# Patient Record
Sex: Female | Born: 1995 | Race: White | Hispanic: No | Marital: Single | Smoking: Current every day smoker
Health system: Southern US, Community
[De-identification: ages and names within clinical notes are randomized; demographics above are authoritative.]

## PROBLEM LIST (undated history)

## (undated) DIAGNOSIS — J45909 Unspecified asthma, uncomplicated: Secondary | ICD-10-CM

## (undated) HISTORY — PX: INCISE AND DRAIN ABCESS: PRO64

## (undated) HISTORY — DX: Unspecified asthma, uncomplicated: J45.909

---
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
## 2017-07-02 ENCOUNTER — Other Ambulatory Visit: Payer: Self-pay

## 2017-07-02 ENCOUNTER — Encounter: Payer: Self-pay | Admitting: Emergency Medicine

## 2017-07-02 ENCOUNTER — Emergency Department
Admission: EM | Admit: 2017-07-02 | Discharge: 2017-07-02 | Disposition: A | Discharge status: Home / Self Care | Payer: Medicaid Other | Attending: Emergency Medicine | Admitting: Emergency Medicine

## 2017-07-02 DIAGNOSIS — F17210 Nicotine dependence, cigarettes, uncomplicated: Secondary | ICD-10-CM | POA: Diagnosis not present

## 2017-07-02 DIAGNOSIS — N390 Urinary tract infection, site not specified: Secondary | ICD-10-CM

## 2017-07-02 DIAGNOSIS — R300 Dysuria: Secondary | ICD-10-CM

## 2017-07-02 DIAGNOSIS — J45909 Unspecified asthma, uncomplicated: Secondary | ICD-10-CM | POA: Diagnosis not present

## 2017-07-02 DIAGNOSIS — Z79899 Other long term (current) drug therapy: Secondary | ICD-10-CM | POA: Insufficient documentation

## 2017-07-02 LAB — COMPREHENSIVE METABOLIC PANEL
ALBUMIN: 4.4 g/dL (ref 3.5–5.0)
ALT: 11 U/L — AB (ref 14–54)
AST: 23 U/L (ref 15–41)
Alkaline Phosphatase: 66 U/L (ref 38–126)
Anion gap: 7 (ref 5–15)
BUN: 13 mg/dL (ref 6–20)
CHLORIDE: 109 mmol/L (ref 101–111)
CO2: 23 mmol/L (ref 22–32)
CREATININE: 0.8 mg/dL (ref 0.44–1.00)
Calcium: 8.9 mg/dL (ref 8.9–10.3)
GFR calc Af Amer: >60 mL/min (ref >60)
GLUCOSE: 106 mg/dL — AB (ref 65–99)
Potassium: 4 mmol/L (ref 3.5–5.1)
Sodium: 139 mmol/L (ref 135–145)
Total Bilirubin: 0.4 mg/dL (ref 0.3–1.2)
Total Protein: 7.6 g/dL (ref 6.5–8.1)

## 2017-07-02 LAB — URINALYSIS, COMPLETE (UACMP) WITH MICROSCOPIC
Bilirubin Urine: NEGATIVE
Glucose, UA: NEGATIVE mg/dL
Ketones, ur: 5 mg/dL — AB
Nitrite: NEGATIVE
PH: 5 (ref 5.0–8.0)
Protein, ur: 100 mg/dL — AB
RBC / HPF: >50 RBC/hpf — ABNORMAL HIGH (ref 0–5)
SPECIFIC GRAVITY, URINE: 1.04 — AB (ref 1.005–1.030)
WBC, UA: >50 WBC/hpf — ABNORMAL HIGH (ref 0–5)

## 2017-07-02 LAB — CBC
HEMATOCRIT: 39.3 % (ref 35.0–47.0)
Hemoglobin: 13.4 g/dL (ref 12.0–16.0)
MCH: 29.2 pg (ref 26.0–34.0)
MCHC: 34.2 g/dL (ref 32.0–36.0)
MCV: 85.5 fL (ref 80.0–100.0)
PLATELETS: 241 10*3/uL (ref 150–440)
RBC: 4.6 MIL/uL (ref 3.80–5.20)
RDW: 13.9 % (ref 11.5–14.5)
WBC: 10.6 10*3/uL (ref 3.6–11.0)

## 2017-07-02 LAB — POCT PREGNANCY, URINE: PREG TEST UR: NEGATIVE

## 2017-07-02 MED ORDER — PHENAZOPYRIDINE HCL 200 MG PO TABS
200.0000 mg | ORAL_TABLET | Freq: Once | ORAL | Status: AC
  Administered 2017-07-02: 200 mg via ORAL
  Filled 2017-07-02 (×2): qty 1

## 2017-07-02 MED ORDER — PHENAZOPYRIDINE HCL 200 MG PO TABS: 200.0000 mg | ORAL_TABLET | Freq: Three times a day (TID) | ORAL | 0 refills | Status: AC | PRN

## 2017-07-02 MED ORDER — NITROFURANTOIN MONOHYD MACRO 100 MG PO CAPS
100.0000 mg | ORAL_CAPSULE | Freq: Once | ORAL | Status: AC
  Administered 2017-07-02: 100 mg via ORAL
  Filled 2017-07-02 (×2): qty 1

## 2017-07-02 MED ORDER — NITROFURANTOIN MONOHYD MACRO 100 MG PO CAPS: 100.0000 mg | ORAL_CAPSULE | Freq: Two times a day (BID) | ORAL | 0 refills | Status: AC

## 2017-07-02 NOTE — ED Provider Notes (Signed)
[facility] Emergency Department Provider Note   ____________________________________________   I have reviewed the triage vital signs and the nursing notes.   HISTORY  Chief Complaint Abdominal Pain   History limited by: Not Limited   HPI Paige Mitchell is a 22 y.o. female who presents to the emergency department today because of concerns for abdominal pain and dysuria. The patient states the symptoms have been present for the past few days. She describes the pain as burning. She has also had increased frequency and sense of urgency. She has had some pain in her lower back. Denies any fevers. She is not currently menstrauting.   Per medical record review patient has a history of asthma.  Past Medical History:  Diagnosis Date  . Asthma     Patient Active Problem List   Diagnosis Date Noted  . Pilonidal cyst with abscess     Past Surgical History:  Procedure Laterality Date  . COLONOSCOPY WITH ESOPHAGOGASTRODUODENOSCOPY (EGD)  2014   [facility]  . INCISE AND DRAIN ABCESS     Right Groin- Positive MRSA per patient  . PILONIDAL CYST DRAINAGE  01/2014   Dr. Juliann Pulse  . PILONIDAL CYST DRAINAGE N/A 03/30/2016   Procedure: IRRIGATION AND DEBRIDEMENT PILONIDAL CYST. Prone position;  Surgeon: Ricarda Frame, MD;  Location: [facility] ORS;  Service: General;  Laterality: N/A;    Prior to Admission medications   Medication Sig Start Date End Date Taking? Authorizing Provider  albuterol (PROVENTIL HFA;VENTOLIN HFA) 108 (90 Base) MCG/ACT inhaler Inhale 2 puffs into the lungs every 4 (four) hours as needed for wheezing or shortness of breath. 11/22/16   Cuthriell, Delorise Royals, PA-C  brompheniramine-pseudoephedrine-DM 30-2-10 MG/5ML syrup Take 10 mLs by mouth 4 (four) times daily as needed. 11/22/16   Cuthriell, Delorise Royals, PA-C  fluticasone (FLONASE) 50 MCG/ACT nasal spray Place 1 spray into both nostrils 2 (two) times daily. 11/22/16   Cuthriell, Delorise Royals, PA-C   Multiple Vitamin (MULTIVITAMIN) tablet Take 1 tablet by mouth daily.    [provider]  predniSONE (DELTASONE) 50 MG tablet Take 1 tablet (50 mg total) by mouth daily with breakfast. 11/22/16   Cuthriell, Delorise Royals, PA-C    Allergies Ibuprofen  Family History  Problem Relation Age of Onset  . Cancer Mother        cervical  . Psoriasis Mother     Social History Social History   Tobacco Use  . Smoking status: Current Some Day Smoker    Types: Cigarettes  . Smokeless tobacco: Never Used  Substance Use Topics  . Alcohol use: Yes    Comment: occ  . Drug use: No    Review of Systems Constitutional: No fever/chills Eyes: No visual changes. ENT: No sore throat. Cardiovascular: Denies chest pain. Respiratory: Denies shortness of breath. Gastrointestinal: Positive for lower abdominal pain.  Genitourinary: Positive for dysuria. Musculoskeletal: Negative for back pain. Skin: Negative for rash. Neurological: Negative for headaches, focal weakness or numbness.  ____________________________________________   PHYSICAL EXAM:  VITAL SIGNS: ED Triage Vitals  Enc Vitals Group     BP 07/02/17 1916 111/69     Pulse Rate 07/02/17 1916 70     Resp 07/02/17 1916 18     Temp 07/02/17 1916 98.4 F (36.9 C)     Temp Source 07/02/17 1916 Oral     SpO2 07/02/17 1916 100 %     Weight 07/02/17 1917 124 lb (56.2 kg)     Height 07/02/17 1917 5\' 5"  (1.651  m)     Head Circumference --      Peak Flow --      Pain Score 07/02/17 1917 9   Constitutional: Alert and oriented. Well appearing and in no distress. Eyes: Conjunctivae are normal.  ENT   Head: Normocephalic and atraumatic.   Nose: No congestion/rhinnorhea.   Mouth/Throat: Mucous membranes are moist.   Neck: No stridor. Hematological/Lymphatic/Immunilogical: No cervical lymphadenopathy. Cardiovascular: Normal rate, regular rhythm.  No murmurs, rubs, or gallops.  Respiratory: Normal respiratory effort  without tachypnea nor retractions. Breath sounds are clear and equal bilaterally. No wheezes/rales/rhonchi. Gastrointestinal: Soft and minimally tender to palpation in the lower abdomen. No CVA tenderness. No rebound. No guarding.  Genitourinary: Deferred Musculoskeletal: Normal range of motion in all extremities. No lower extremity edema. Neurologic:  Normal speech and language. No gross focal neurologic deficits are appreciated.  Skin:  Skin is warm, dry and intact. No rash noted. Psychiatric: Mood and affect are normal. Speech and behavior are normal. Patient exhibits appropriate insight and judgment.  ____________________________________________    LABS (pertinent positives/negatives)  Upreg negative CMP wnl except glu 106, alt 11 CBC wnl UA turbid, urine dipstick moderate, moderate leukocytes, rbc >50, wbc >50 ____________________________________________   EKG  None  ____________________________________________    RADIOLOGY  None   ____________________________________________   PROCEDURES  Procedures  ____________________________________________   INITIAL IMPRESSION / ASSESSMENT AND PLAN / ED COURSE  Pertinent labs & imaging results that were available during my care of the patient were reviewed by me and considered in my medical decision making (see chart for details).  Patient presented to the emergency department today because of concerns for lower abdominal pain and dysuria.  Urine does have white blood cells and leukocytes.  There are some red blood cells however patient sent menstruation.  At this point I think urinary tract infection likely.  Will give dose of antibiotics here in the emergency department.  Discussed findings with patient.   ____________________________________________   FINAL CLINICAL IMPRESSION(S) / ED DIAGNOSES  Final diagnoses:  Lower urinary tract infectious disease  Dysuria     Note: This dictation was prepared with Dragon  dictation. Any transcriptional errors that result from this process are unintentional     Phineas SemenGoodman, Kashtyn Jankowski, MD 07/02/17 2052

## 2017-07-02 NOTE — ED Triage Notes (Signed)
Patient with complaint of lower abdominal pain with nausea times two days. Patient last BM was today. Patient states that she started having pain with urination and frequency that started this morning.

## 2017-07-02 NOTE — ED Notes (Signed)
See triage note.  Patient with pain during urination.

## 2017-07-02 NOTE — Discharge Instructions (Addendum)
Please seek medical attention for any high fevers, chest pain, shortness of breath, change in behavior, persistent vomiting, bloody stool or any other new or concerning symptoms.  

## 2017-12-07 IMAGING — CR DG ELBOW COMPLETE 3+V*R*
4 series · 4 of 4 positions shown · non-contrast
Comparison: None.

CLINICAL DATA: Acute right elbow pain following fall 2 days ago.
Initial encounter.

EXAM:
RIGHT ELBOW - COMPLETE 3+ VIEW

[elbow ap]
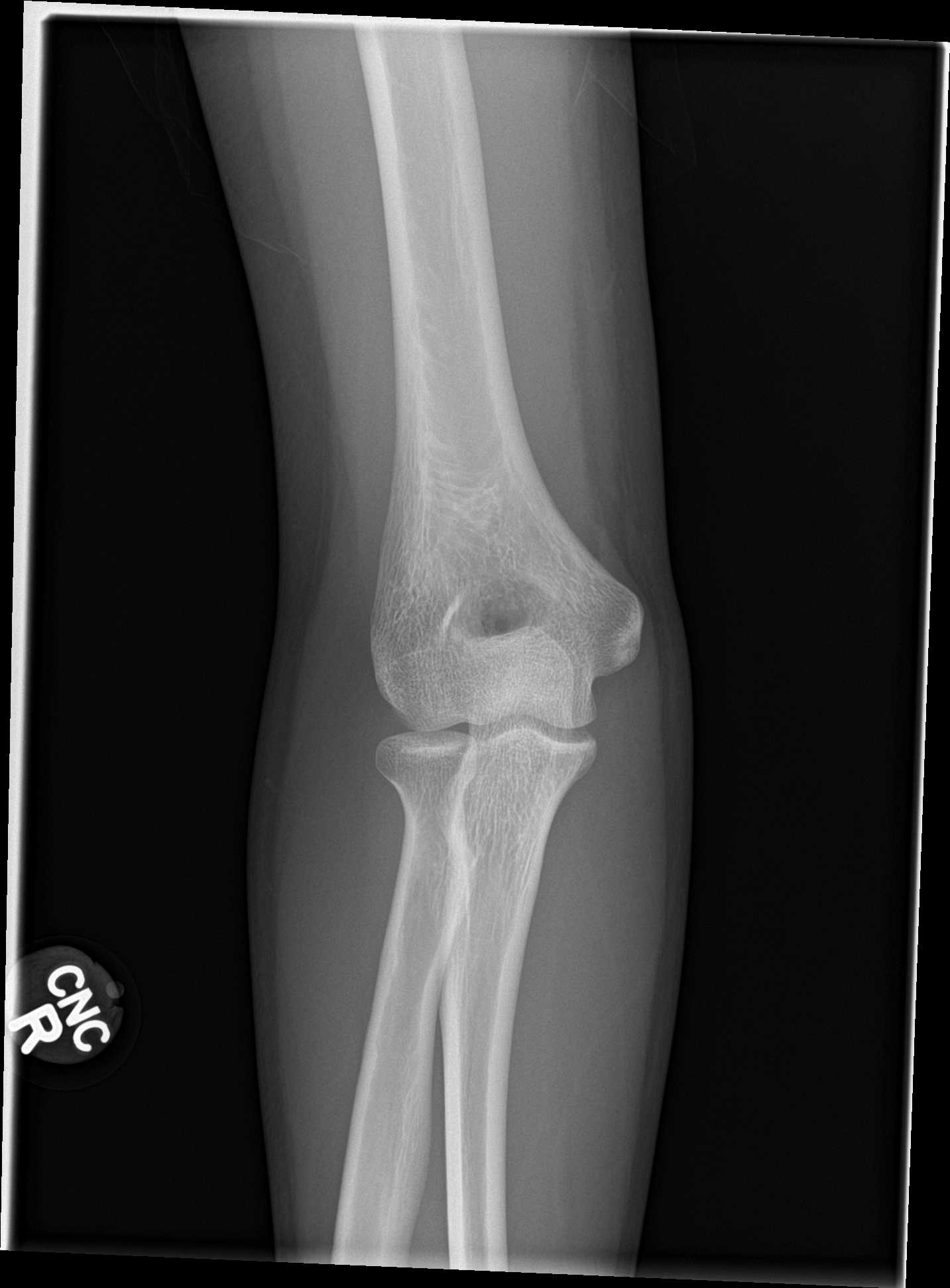

[elbow obl (1 of 2)]
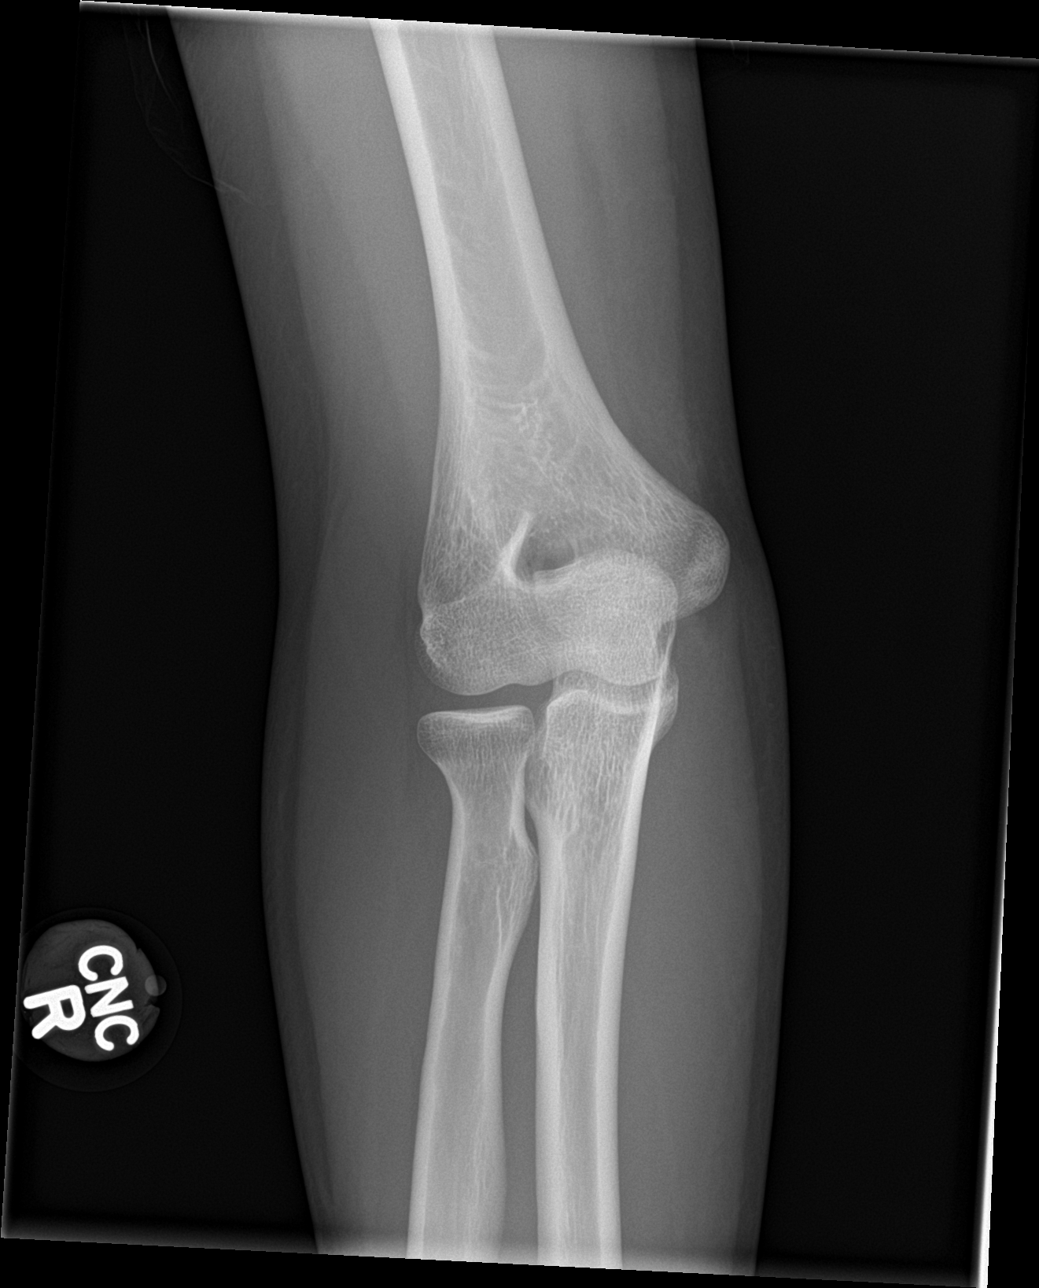

[elbow obl (2 of 2)]
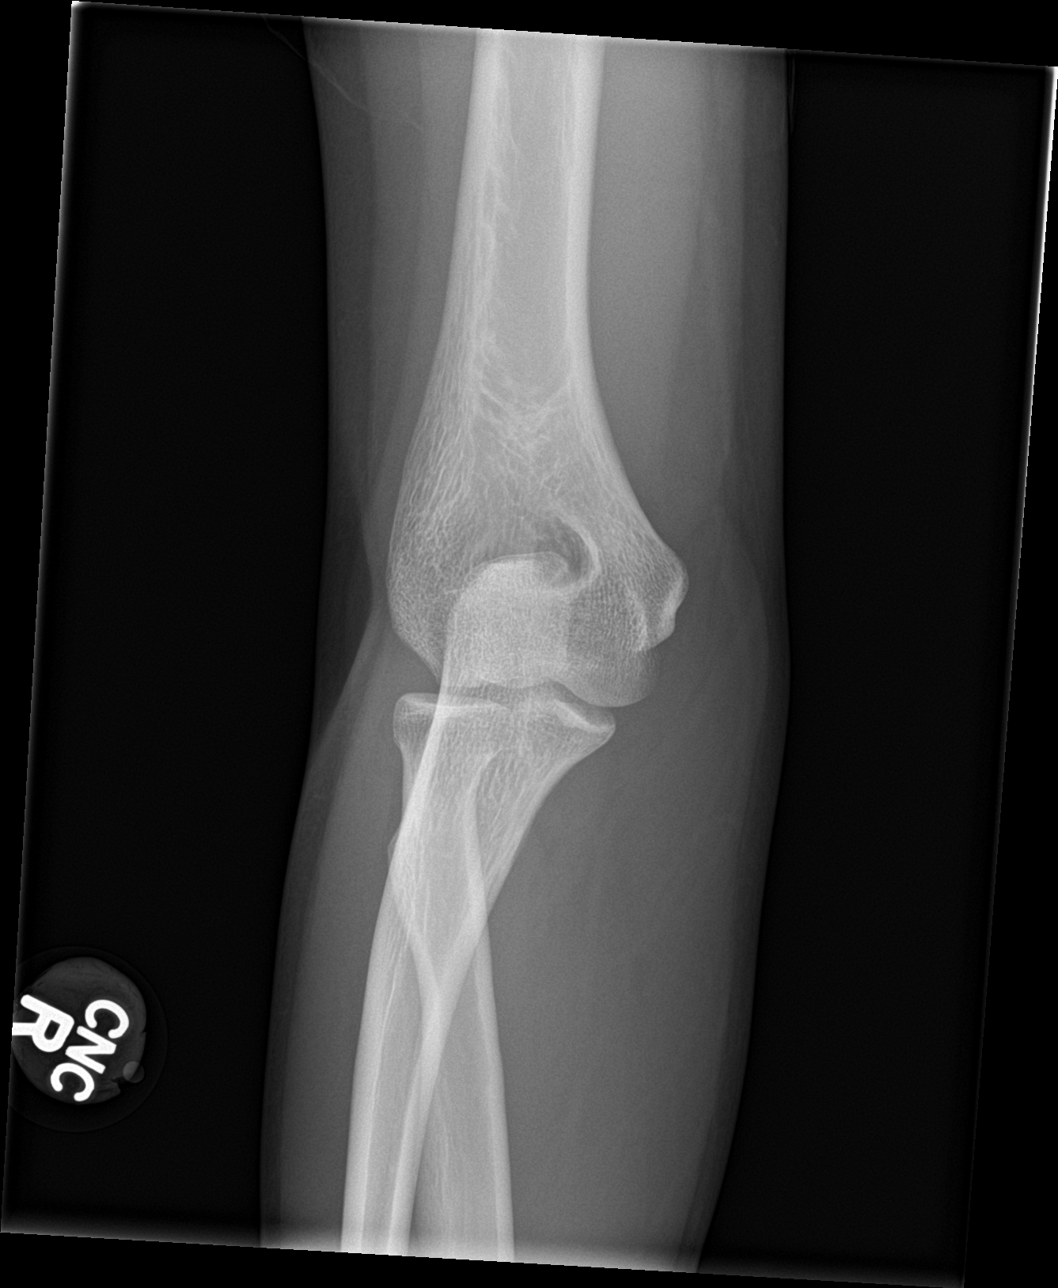

[elbow lat]
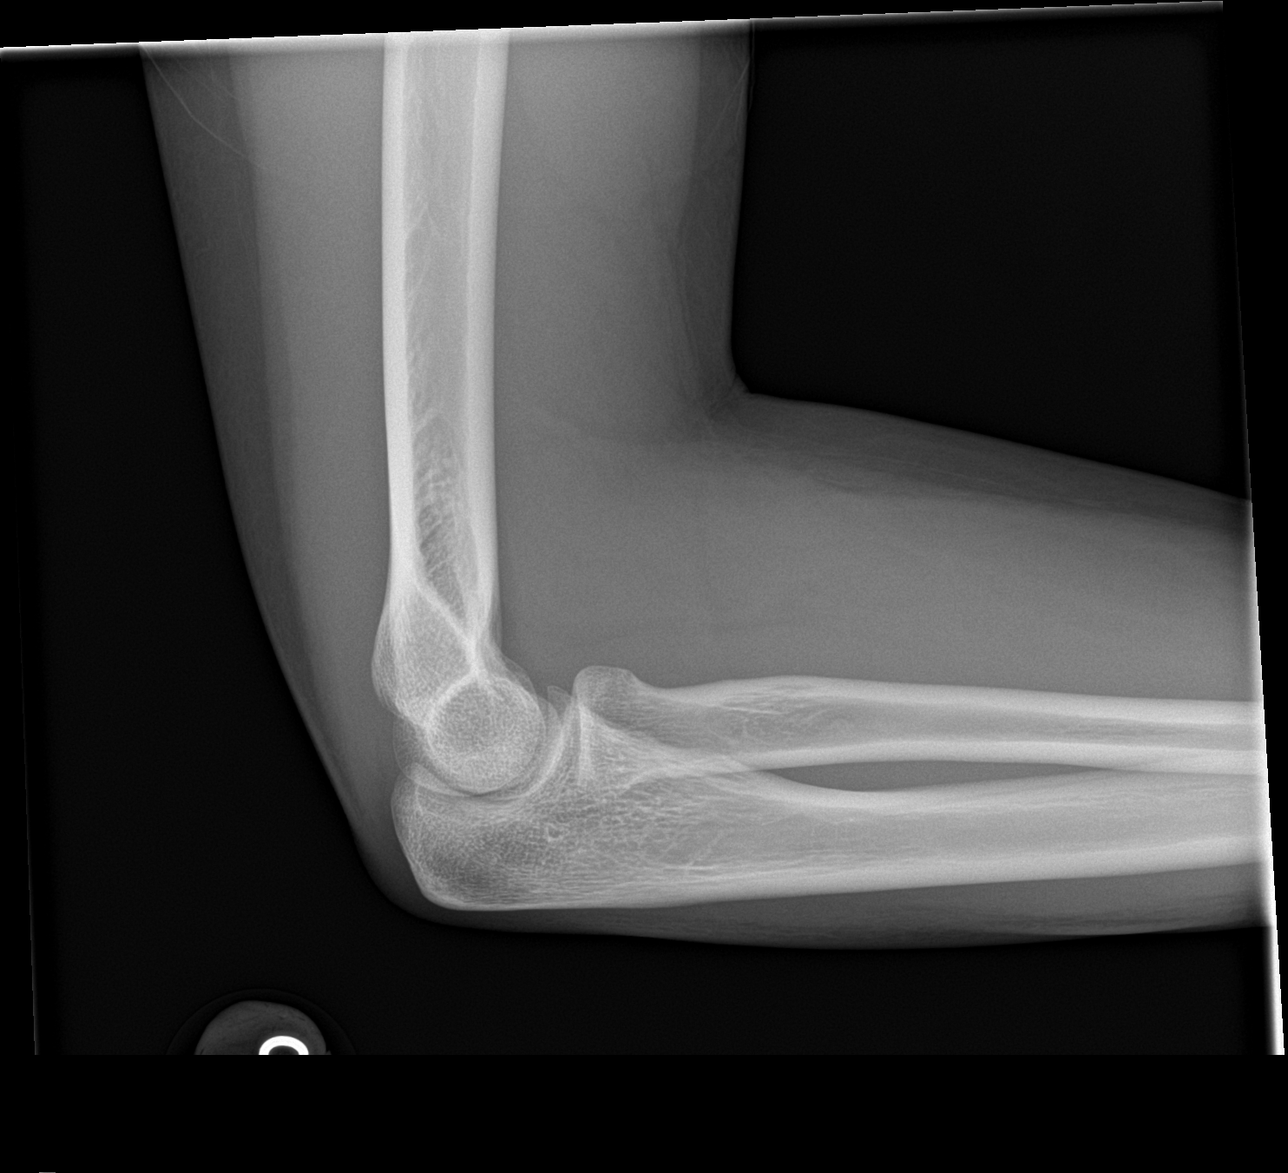

[4 of 4 positions shown; findings below may reference images not displayed]

FINDINGS: There is no evidence of fracture, dislocation, or joint effusion.
There is no evidence of arthropathy or other focal bone abnormality.
Soft tissues are unremarkable.
IMPRESSION: Negative.

## 2017-12-07 IMAGING — CR DG CHEST 2V
2 series · 2 of 2 positions shown · non-contrast
Comparison: 05/12/2005 chest radiograph

CLINICAL DATA: Chest pain and shortness of breath for 2 days.

EXAM:
CHEST  2 VIEW

[chest pa]
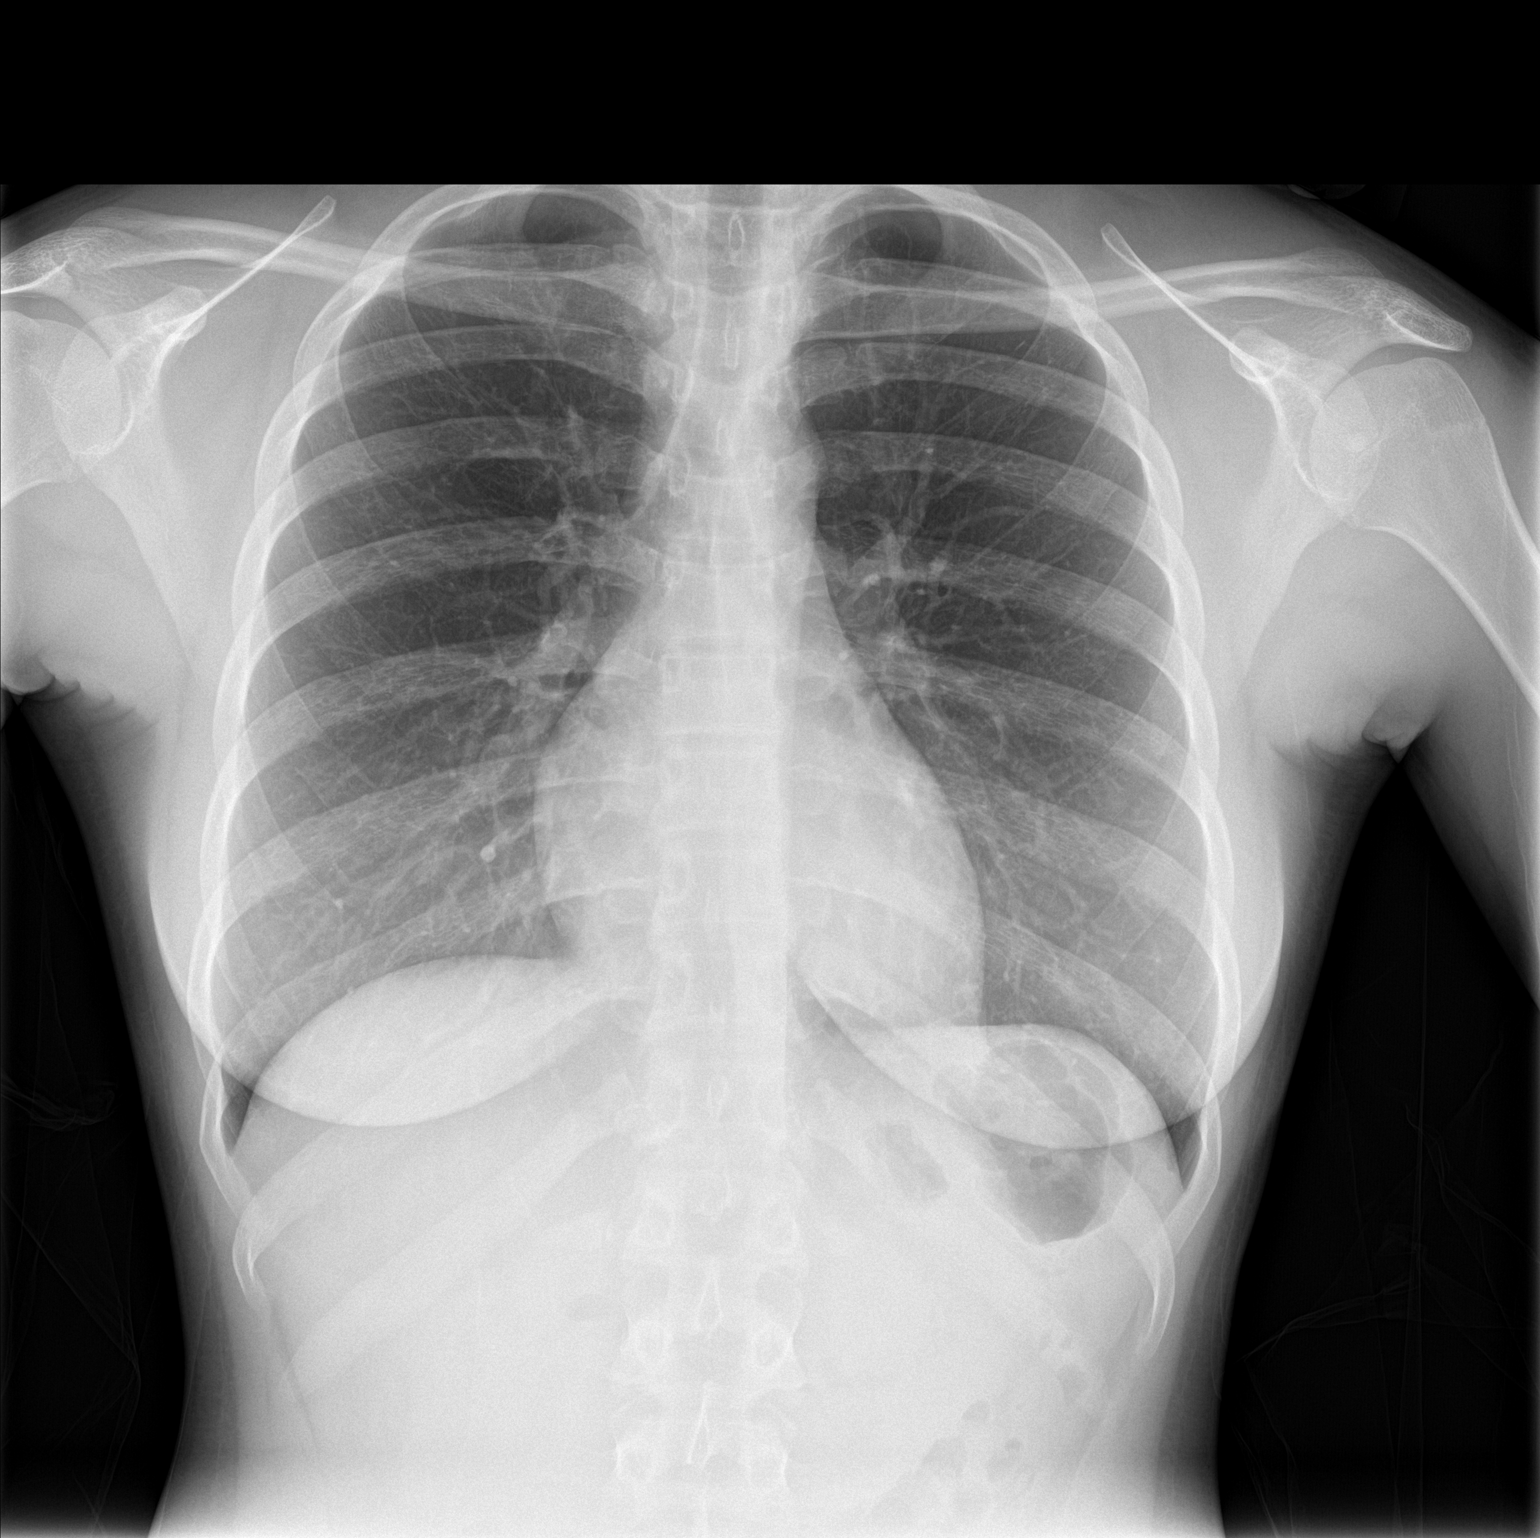

[chest lat]
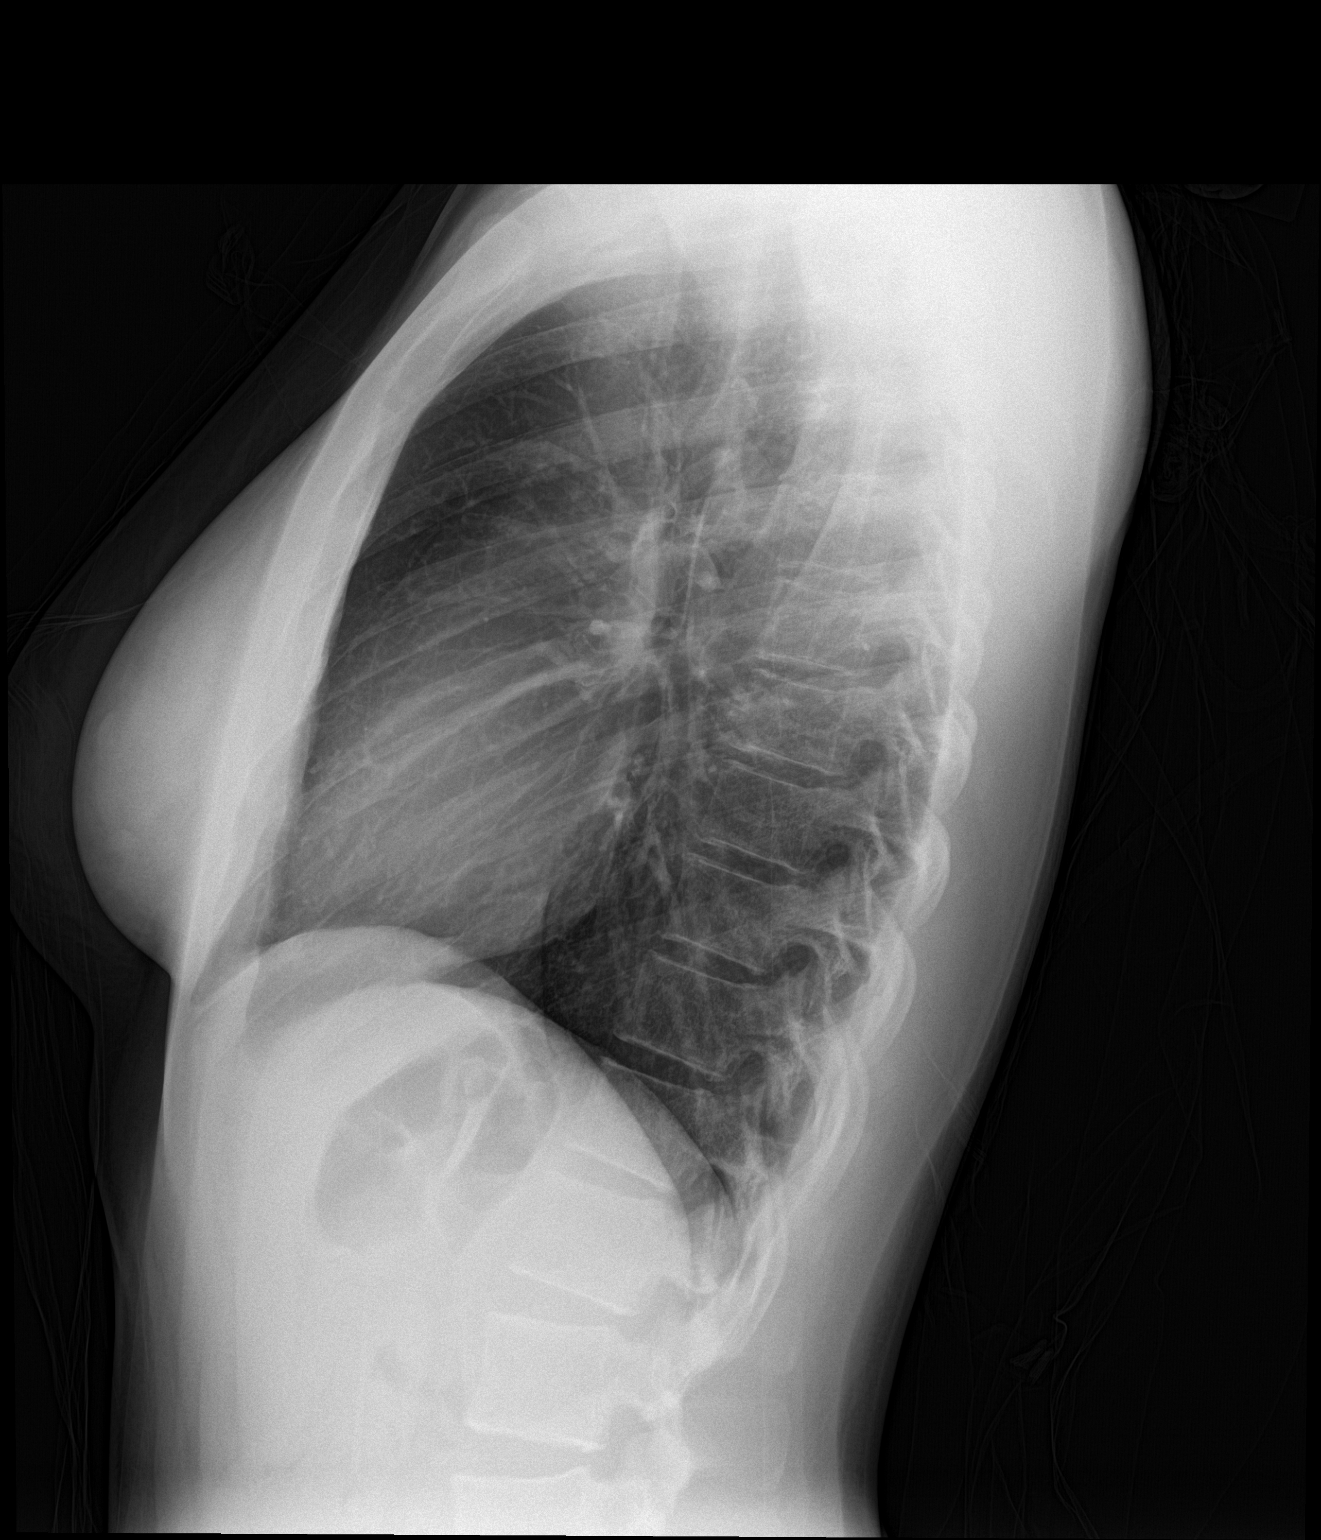

[2 of 2 positions shown; findings below may reference images not displayed]

FINDINGS: The cardiomediastinal silhouette is unremarkable.

There is no evidence of focal airspace disease, pulmonary edema,
suspicious pulmonary nodule/mass, pleural effusion, or pneumothorax.
No acute bony abnormalities are identified.
IMPRESSION: No active cardiopulmonary disease.

## 2018-03-10 ENCOUNTER — Encounter: Payer: Self-pay | Admitting: Emergency Medicine

## 2018-03-10 ENCOUNTER — Other Ambulatory Visit: Payer: Self-pay

## 2018-03-10 ENCOUNTER — Emergency Department: Payer: Medicaid Other

## 2018-03-10 ENCOUNTER — Emergency Department
Admission: EM | Admit: 2018-03-10 | Discharge: 2018-03-10 | Disposition: A | Discharge status: Home / Self Care | Payer: Medicaid Other | Attending: Emergency Medicine | Admitting: Emergency Medicine

## 2018-03-10 DIAGNOSIS — S8992XA Unspecified injury of left lower leg, initial encounter: Secondary | ICD-10-CM | POA: Diagnosis present

## 2018-03-10 DIAGNOSIS — Z79899 Other long term (current) drug therapy: Secondary | ICD-10-CM | POA: Insufficient documentation

## 2018-03-10 DIAGNOSIS — Y939 Activity, unspecified: Secondary | ICD-10-CM | POA: Diagnosis not present

## 2018-03-10 DIAGNOSIS — J45909 Unspecified asthma, uncomplicated: Secondary | ICD-10-CM | POA: Insufficient documentation

## 2018-03-10 DIAGNOSIS — S8002XA Contusion of left knee, initial encounter: Secondary | ICD-10-CM | POA: Diagnosis not present

## 2018-03-10 DIAGNOSIS — Y999 Unspecified external cause status: Secondary | ICD-10-CM | POA: Diagnosis not present

## 2018-03-10 DIAGNOSIS — F17210 Nicotine dependence, cigarettes, uncomplicated: Secondary | ICD-10-CM | POA: Diagnosis not present

## 2018-03-10 DIAGNOSIS — Y929 Unspecified place or not applicable: Secondary | ICD-10-CM | POA: Diagnosis not present

## 2018-03-10 DIAGNOSIS — W2209XA Striking against other stationary object, initial encounter: Secondary | ICD-10-CM | POA: Diagnosis not present

## 2018-03-10 MED ORDER — PREDNISONE 10 MG PO TABS: 10.0000 mg | ORAL_TABLET | Freq: Every day | ORAL | 0 refills | Status: DC

## 2018-03-10 NOTE — ED Provider Notes (Signed)
[facility] Emergency Department Provider Note  ____________________________________________  Time seen: Approximately 7:18 PM  I have reviewed the triage vital signs and the nursing notes.   HISTORY  Chief Complaint Knee Pain    HPI Paige Mitchell is a 23 y.o. female who presents the emergency department complaining of left knee pain.  Patient reports that she accidentally struck her knee on the edge of a stage at work several days prior.  Patient reports that she is tried ice, heat, Tylenol Motrin with no relief.  Patient is concerned she may have done further damage to her knee.  She is able to bear weight but doing so increases pain.  She has full range of motion to the knee.  No other injury or complaint.    Past Medical History:  Diagnosis Date  . Asthma     Patient Active Problem List   Diagnosis Date Noted  . Pilonidal cyst with abscess     Past Surgical History:  Procedure Laterality Date  . COLONOSCOPY WITH ESOPHAGOGASTRODUODENOSCOPY (EGD)  2014   [facility]  . INCISE AND DRAIN ABCESS     Right Groin- Positive MRSA per patient  . PILONIDAL CYST DRAINAGE  01/2014   Dr. Juliann Pulse  . PILONIDAL CYST DRAINAGE N/A 03/30/2016   Procedure: IRRIGATION AND DEBRIDEMENT PILONIDAL CYST. Prone position;  Surgeon: Ricarda Frame, MD;  Location: [facility] ORS;  Service: General;  Laterality: N/A;    Prior to Admission medications   Medication Sig Start Date End Date Taking? Authorizing Provider  albuterol (PROVENTIL HFA;VENTOLIN HFA) 108 (90 Base) MCG/ACT inhaler Inhale 2 puffs into the lungs every 4 (four) hours as needed for wheezing or shortness of breath. 11/22/16   Fay Bagg, Delorise Royals, PA-C  brompheniramine-pseudoephedrine-DM 30-2-10 MG/5ML syrup Take 10 mLs by mouth 4 (four) times daily as needed. 11/22/16   Jeylin Woodmansee, Delorise Royals, PA-C  fluticasone (FLONASE) 50 MCG/ACT nasal spray Place 1 spray into both nostrils 2 (two) times daily. 11/22/16   Demere Dotzler,  Delorise Royals, PA-C  Multiple Vitamin (MULTIVITAMIN) tablet Take 1 tablet by mouth daily.    [provider]  phenazopyridine (PYRIDIUM) 200 MG tablet Take 1 tablet (200 mg total) by mouth 3 (three) times daily as needed for pain. 07/02/17 07/02/18  Phineas Semen, MD  predniSONE (DELTASONE) 10 MG tablet Take 1 tablet (10 mg total) by mouth daily. 03/10/18   Moshe Wenger, Delorise Royals, PA-C    Allergies Ibuprofen  Family History  Problem Relation Age of Onset  . Cancer Mother        cervical  . Psoriasis Mother     Social History Social History   Tobacco Use  . Smoking status: Current Some Day Smoker    Types: Cigarettes  . Smokeless tobacco: Never Used  Substance Use Topics  . Alcohol use: Yes    Comment: occ  . Drug use: No     Review of Systems  Constitutional: No fever/chills Eyes: No visual changes. No discharge ENT: No upper respiratory complaints. Cardiovascular: no chest pain. Respiratory: no cough. No SOB. Gastrointestinal: No abdominal pain.  No nausea, no vomiting.   Musculoskeletal: Positive for left knee pain Skin: Negative for rash, abrasions, lacerations, ecchymosis. Neurological: Negative for headaches, focal weakness or numbness. 10-point ROS otherwise negative.  ____________________________________________   PHYSICAL EXAM:  VITAL SIGNS: ED Triage Vitals  Enc Vitals Group     BP 03/10/18 1756 112/67     Pulse Rate 03/10/18 1756 71     Resp 03/10/18 1756  18     Temp 03/10/18 1756 98.5 F (36.9 C)     Temp Source 03/10/18 1756 Oral     SpO2 03/10/18 1756 100 %     Weight --      Height --      Head Circumference --      Peak Flow --      Pain Score 03/10/18 1758 6     Pain Loc --      Pain Edu? --      Excl. in GC? --      Constitutional: Alert and oriented. Well appearing and in no acute distress. Eyes: Conjunctivae are normal. PERRL. EOMI. Head: Atraumatic. Neck: No stridor.    Cardiovascular: Normal rate, regular rhythm. Normal  S1 and S2.  Good peripheral circulation. Respiratory: Normal respiratory effort without tachypnea or retractions. Lungs CTAB. Good air entry to the bases with no decreased or absent breath sounds. Musculoskeletal: Full range of motion to all extremities. No gross deformities appreciated.  Visualization of the left knee reveals no gross signs of trauma.  No laceration, abrasions, ecchymosis, edema, deformity.  Full range of motion to the knee.  Patient is tender to palpation over the patella, tibial tuberosity, lateral knee.  No palpable abnormality.  No ballottement.  Varus, valgus, Lachman's, McMurray's is negative.  Dorsalis pedis pulse intact distally.  Sensation intact distally. Neurologic:  Normal speech and language. No gross focal neurologic deficits are appreciated.  Skin:  Skin is warm, dry and intact. No rash noted. Psychiatric: Mood and affect are normal. Speech and behavior are normal. Patient exhibits appropriate insight and judgement.   ____________________________________________   LABS (all labs ordered are listed, but only abnormal results are displayed)  Labs Reviewed - No data to display ____________________________________________  EKG   ____________________________________________  RADIOLOGY I personally viewed and evaluated these images as part of my medical decision making, as well as reviewing the written report by the radiologist.  I concur with no acute osseous abnormality.  Visualized fibular osteochondroma is appreciated.  Dg Knee Complete 4 Views Left  Result Date: 03/10/2018 CLINICAL DATA:  Acute knee pain for the past 3 days after injury. EXAM: LEFT KNEE - COMPLETE 4+ VIEW COMPARISON:  None. FINDINGS: No acute fracture or dislocation. No joint effusion. Small osteochondroma arising from the medial aspect of the proximal fibula. Joint spaces are preserved. Bone mineralization is normal. Soft tissues are unremarkable. IMPRESSION: 1.  No acute osseous abnormality.  2. Small proximal fibula osteochondroma. Electronically Signed   By: Obie DredgeWilliam T Derry M.D.   On: 03/10/2018 18:46    ____________________________________________    PROCEDURES  Procedure(s) performed:    Procedures    Medications - No data to display   ____________________________________________   INITIAL IMPRESSION / ASSESSMENT AND PLAN / ED COURSE  Pertinent labs & imaging results that were available during my care of the patient were reviewed by me and considered in my medical decision making (see chart for details).  Review of the [place] CSRS was performed in accordance of the [facility] prior to dispensing any controlled drugs.      Patient's diagnosis is consistent with left knee contusion.  Patient presents emergency department complaining of left knee pain.  Exam was overall reassuring.  No acute osseous abnormality on x-ray.  Small osteochondroma is appreciated incidentally.  Patient will be placed on meloxicam.  Follow-up primary care as needed.  Patient is given ED precautions to return to the ED for any worsening or new symptoms.  ____________________________________________  FINAL CLINICAL IMPRESSION(S) / ED DIAGNOSES  Final diagnoses:  Contusion of left knee, initial encounter      NEW MEDICATIONS STARTED DURING THIS VISIT:  ED Discharge Orders         Ordered    predniSONE (DELTASONE) 10 MG tablet  Daily    Note to Pharmacy:  Take 6 pills x 2 days, 5 pills x 2 days, 4 pills x 2 days, 3 pills x 2 days, 2 pills x 2 days, and 1 pill x 2 days   03/10/18 1929              This chart was dictated using voice recognition software/Dragon. Despite best efforts to proofread, errors can occur which can change the meaning. Any change was purely unintentional.    Racheal Patches, PA-C 03/10/18 1929    Dionne Bucy, MD 03/10/18 2314

## 2018-03-10 NOTE — ED Triage Notes (Signed)
PT c/o LFT knee pain after hitting on stage at work x3days ago. No swelling noted. NAD noted

## 2018-03-10 NOTE — ED Notes (Signed)
See triage note  Presents with left knee pain  States she hit her knee 3 days ago  conts to have pain  No deformity noted

## 2020-12-09 ENCOUNTER — Encounter: Payer: Self-pay | Admitting: Physician Assistant

## 2020-12-09 ENCOUNTER — Ambulatory Visit (LOCAL_COMMUNITY_HEALTH_CENTER): Payer: Medicaid Other | Admitting: Physician Assistant

## 2020-12-09 ENCOUNTER — Other Ambulatory Visit: Payer: Self-pay

## 2020-12-09 VITALS — BP 114/71 | Ht 64.25 in | Wt 169.4 lb

## 2020-12-09 DIAGNOSIS — Z5321 Procedure and treatment not carried out due to patient leaving prior to being seen by health care provider: Secondary | ICD-10-CM

## 2020-12-09 NOTE — Progress Notes (Signed)
Patient on schedule for IP/RP today.  When I went to the exam room to see patient, the room was empty.  Waited about 10  minutes to see if patient had returned to the room.  RN attempted to call patient, but phone number hung up on the first call and on the second call, got a message that the phone number could not receive calls at this time.  Patient left without seeing provider.

## 2021-01-01 ENCOUNTER — Encounter: Payer: Self-pay | Admitting: Physician Assistant

## 2021-01-01 ENCOUNTER — Ambulatory Visit: Payer: Medicaid Other | Admitting: Physician Assistant

## 2021-01-01 ENCOUNTER — Other Ambulatory Visit: Payer: Self-pay

## 2021-01-01 VITALS — BP 120/80 | HR 78 | Temp 99.0°F | Ht 64.0 in | Wt 170.2 lb

## 2021-01-01 DIAGNOSIS — L0501 Pilonidal cyst with abscess: Secondary | ICD-10-CM | POA: Diagnosis not present

## 2021-01-01 MED ORDER — AMOXICILLIN-POT CLAVULANATE 875-125 MG PO TABS: 1.0000 | ORAL_TABLET | Freq: Two times a day (BID) | ORAL | 0 refills | Status: AC

## 2021-01-01 MED ORDER — OXYCODONE HCL 5 MG PO TABS: 5.0000 mg | ORAL_TABLET | Freq: Four times a day (QID) | ORAL | 0 refills | Status: DC | PRN

## 2021-01-01 NOTE — Patient Instructions (Addendum)
Please pick up your medication at the pharmacy today. Begin taking the Antibiotic today. You may try warm soaks in the tub.    Pilonidal Cyst A pilonidal cyst is a fluid-filled sac that forms beneath the skin near the tailbone, at the top of the crease of the buttocks (pilonidal area). If the cyst is not large and not infected, it may not cause any problems. If the cyst becomes irritated or infected, it may get larger and fill with pus. An infected cyst is called an abscess. A pilonidal abscess may cause pain and swelling, and it may need to be drained or removed. What are the causes? The cause of this condition is not always known. In some cases, a hair that grows into your skin (ingrown hair) may be the cause. What increases the risk? You are more likely to get a pilonidal cyst if you: Are female. Have lots of hair near the crease of the buttocks. Are overweight. Have a dimple near the crease of the buttocks. Wear tight clothing. Do not bathe or shower often. Sit for long periods of time. What are the signs or symptoms? Signs and symptoms of a pilonidal cyst may include pain, swelling, redness, and warmth in the pilonidal area. Depending on how big the cyst is, you may be able to feel a lump near your tailbone. If your cyst becomes infected, symptoms may include: Pus or fluid drainage. Fever. Pain, swelling, and redness getting worse. The lump getting bigger. How is this diagnosed? This condition may be diagnosed based on: Your symptoms and medical history. A physical exam. A blood test to check for infection. Testing a pus sample, if applicable. How is this treated? If your cyst does not cause symptoms, you may not need any treatment. If your cyst bothers you or is infected, you may need a procedure to drain or remove the cyst. Depending on the size, location, and severity of your cyst, your health care provider may: Make an incision in the cyst and drain it (incision and  drainage). Open and drain the cyst, and then stitch the wound so that it stays open while it heals (marsupialization). You will be given instructions about how to care for your open wound while it heals. Remove all or part of the cyst, and then close the wound (cyst removal). You may need to take antibiotic medicines before your procedure. Follow these instructions at home: Medicines Take over-the-counter and prescription medicines only as told by your health care provider. If you were prescribed an antibiotic medicine, take it as told by your health care provider. Do not stop taking the antibiotic even if you start to feel better. General instructions Keep the area around your pilonidal cyst clean and dry. If there is fluid or pus draining from your cyst: Cover the area with a clean bandage (dressing) as needed. Wash the area gently with soap and water. Pat the area dry with a clean towel. Do not rub the area because that may cause bleeding. Remove hair from the area around the cyst only if your health care provider tells you to do this. Do not wear tight pants or sit in one position for long periods at a time. Keep all follow-up visits as told by your health care provider. This is important. Contact a health care provider if you have: New redness, swelling, or pain. A fever. Severe pain. Summary A pilonidal cyst is a fluid-filled sac that forms beneath the skin near the tailbone, at the top  of the crease of the buttocks (pilonidal area). If the cyst becomes irritated or infected, it may get larger and fill with pus. An infected cyst is called an abscess. The cause of this condition is not always known. In some cases, a hair that grows into your skin (ingrown hair) may be the cause. If your cyst does not cause symptoms, you may not need any treatment. If your cyst bothers you or is infected, you may need a procedure to drain or remove the cyst. This information is not intended to replace  advice given to you by your health care provider. Make sure you discuss any questions you have with your health care provider. Document Revised: 01/04/2020 Document Reviewed: 01/04/2020 Elsevier Patient Education  2022 [employer].

## 2021-01-01 NOTE — Progress Notes (Signed)
[facility] NEW PATIENT  Referring provider:  Pediatrics, [facility] [address] [place],  [place] [address]  HISTORY OF PRESENT ILLNESS (HPI):  25 y.o. female presents for evaluation of recurrent infected pilonidal cyst. Patient has a known history of similar. She she was last seen in our office in 2018. She had I&D with Dr Tonita Cong on 03/30/2016. She presents today due to recurrence. She reports in the last week, she has notice increasing swelling and pain to the gluteal crease, worse on the right. This feels similar to her previous episodes but worse. She denied any associated fever or chills. There is nothing draining from this area. She does report this is her first episode of recurrence since 2018. She is trying warm compresses without relief. I do not see any Cx results from her previous I&Ds. She is interested in pursuing excision once recovered. No other complaints this morning.    PAST MEDICAL HISTORY (PMH):  Past Medical History:  Diagnosis Date   Asthma      PAST SURGICAL HISTORY (PSH):  Past Surgical History:  Procedure Laterality Date   COLONOSCOPY WITH ESOPHAGOGASTRODUODENOSCOPY (EGD)  2014   [facility]   INCISE AND DRAIN ABCESS     Right Groin- Positive MRSA per patient   PILONIDAL CYST DRAINAGE  01/2014   Dr. Juliann Pulse   PILONIDAL CYST DRAINAGE N/A 03/30/2016   Procedure: IRRIGATION AND DEBRIDEMENT PILONIDAL CYST. Prone position;  Surgeon: Ricarda Frame, MD;  Location: [facility] ORS;  Service: General;  Laterality: N/A;     MEDICATIONS:  Prior to Admission medications   Medication Sig Start Date End Date Taking? Authorizing Provider  albuterol (PROVENTIL HFA;VENTOLIN HFA) 108 (90 Base) MCG/ACT inhaler Inhale 2 puffs into the lungs every 4 (four) hours as needed for wheezing or shortness of breath. 11/22/16  Yes Cuthriell, Delorise Royals, PA-C     ALLERGIES:  Allergies  Allergen Reactions   Ibuprofen Hives and Swelling     SOCIAL HISTORY:   Social History   Socioeconomic History   Marital status: Single    Spouse name: Not on file   Number of children: Not on file   Years of education: Not on file   Highest education level: Not on file  Occupational History   Not on file  Tobacco Use   Smoking status: Every Day    Packs/day: 0.25    Types: Cigarettes   Smokeless tobacco: Never  Vaping Use   Vaping Use: Former  Substance and Sexual Activity   Alcohol use: Not Currently   Drug use: No   Sexual activity: Yes    Partners: Female, Female    Birth control/protection: None  Other Topics Concern   Not on file  Social History Narrative   Not on file   Social Determinants of Health   Financial Resource Strain: Not on file  Food Insecurity: Not on file  Transportation Needs: Not on file  Physical Activity: Not on file  Stress: Not on file  Social Connections: Not on file  Intimate Partner Violence: Not At Risk   Fear of Current or Ex-Partner: No   Emotionally Abused: No   Physically Abused: No   Sexually Abused: No    The patient currently resides (home / rehab facility / nursing home): Home The patient normally is (ambulatory / bedbound): Ambulatory  FAMILY HISTORY:  Family History  Problem Relation Age of Onset   Cancer Mother        cervical   Psoriasis Mother  Otherwise negative/non-contributory.  REVIEW OF SYSTEMS:  Review of Systems  Constitutional:  Negative for chills and fever.  HENT:  Negative for congestion and sore throat.   Respiratory:  Negative for cough and shortness of breath.   Cardiovascular:  Negative for chest pain and palpitations.  Gastrointestinal:  Negative for abdominal pain, nausea and vomiting.  Genitourinary:  Negative for dysuria and urgency.  Skin:        + Pilonidal Cyst Infection   All other systems reviewed and are negative.   VITAL SIGNS:  @VSRANGES @     Height: 5\' 4"  (162.6 cm) Weight: 170 lb 3.2 oz (77.2 kg) BMI (Calculated): 29.2   PHYSICAL EXAM:   Physical Exam Vitals and nursing note reviewed. Exam conducted with a chaperone present.  Constitutional:      General: She is not in acute distress.    Appearance: Normal appearance. She is normal weight. She is not ill-appearing.  HENT:     Head: Normocephalic and atraumatic.  Eyes:     Conjunctiva/sclera: Conjunctivae normal.     Pupils: Pupils are equal, round, and reactive to light.  Cardiovascular:     Rate and Rhythm: Normal rate and regular rhythm.  Pulmonary:     Effort: Pulmonary effort is normal. No respiratory distress.  Genitourinary:    Comments: Deferred Musculoskeletal:     Right lower leg: No edema.     Left lower leg: No edema.  Skin:    General: Skin is warm and dry.     Findings: Erythema present.       Neurological:     General: No focal deficit present.     Mental Status: She is alert and oriented to person, place, and time.  Psychiatric:        Mood and Affect: Mood normal.        Behavior: Behavior normal.    Indurated, no fluctuance   Labs: No new pertinent labs   Imaging studies:  No new pertinent imaging studies    Assessment/Plan:  25 y.o. female with recurrent, infected, pilonidal cyst without drainable abscess.   - I do not appreciate any drainable fluid collections on my examination today. She understands that with time and initiation of Abx/warm compresses, this may consolidate into a drainable collection. I reviewed the signs and symptoms of this, and she was encouraged to call if these develop.   - I will start her on Augmentin BID  - I did give her 15 Oxycodone (5mg ) for pain and QHS; note provided at patient request  - Encouraged to continue use of warm compresses   - I will have her return to clinic in 1-2 once through this acute infection to discuss formal excision to prevent recurrence   All of the above recommendations were discussed with the patient, and all of patient's questions were answered to her expressed  satisfaction.  Thank you for the opportunity to participate in this patient's care.  -- , PA-C [facility] 01/02/2021, 8:11 AM [phone] M-F: 7am - 4pm

## 2021-01-02 ENCOUNTER — Encounter: Payer: Self-pay | Admitting: Physician Assistant

## 2021-01-06 ENCOUNTER — Telehealth: Payer: Self-pay | Admitting: Surgery

## 2021-01-06 ENCOUNTER — Other Ambulatory Visit: Payer: Self-pay

## 2021-01-06 ENCOUNTER — Encounter: Payer: Self-pay | Admitting: Surgery

## 2021-01-06 ENCOUNTER — Ambulatory Visit (INDEPENDENT_AMBULATORY_CARE_PROVIDER_SITE_OTHER): Payer: Medicaid Other | Admitting: Surgery

## 2021-01-06 VITALS — BP 119/77 | HR 88 | Temp 98.7°F | Wt 168.0 lb

## 2021-01-06 DIAGNOSIS — L0501 Pilonidal cyst with abscess: Secondary | ICD-10-CM | POA: Diagnosis not present

## 2021-01-06 MED ORDER — OXYCODONE HCL 5 MG PO TABS: 5.0000 mg | ORAL_TABLET | Freq: Four times a day (QID) | ORAL | 0 refills | Status: DC | PRN

## 2021-01-06 NOTE — Telephone Encounter (Signed)
Pilonidal cyst drainage-denies fever-she took the last oxycodone this morning-she is taking Tylenol also and Allergic to Ibuprofen. Added patient to Dr.Piscoya schedule today in [place].

## 2021-01-06 NOTE — Progress Notes (Signed)
01/06/2021  History of Present Illness: Paige Mitchell is a 25 y.o. female presenting for evaluation of infected pilonidal cyst.  She was last seen on 01/01/2021 with what was developing a pilonidal abscess.  She has a history of 2 prior I&D procedures in 2015 and 2018.  The patient was started on Augmentin at that point but the patient reports that over the weekend things started worsening and the area started becoming more swollen, more tender, and with some drainage of purulent fluid.  The patient's mother reports that is draining minimal amount right now as the opening is very small.  She called the office today and was scheduled for visit.  Patient denies any fevers, chills, chest pain, shortness of breath.  She has been taking the antibiotics prescribed as instructed.  Past Medical History: Past Medical History:  Diagnosis Date   Asthma      Past Surgical History: Past Surgical History:  Procedure Laterality Date   COLONOSCOPY WITH ESOPHAGOGASTRODUODENOSCOPY (EGD)  2014   [facility]   INCISE AND DRAIN ABCESS     Right Groin- Positive MRSA per patient   PILONIDAL CYST DRAINAGE  01/2014   Dr. Juliann Pulse   PILONIDAL CYST DRAINAGE N/A 03/30/2016   Procedure: IRRIGATION AND DEBRIDEMENT PILONIDAL CYST. Prone position;  Surgeon: Ricarda Frame, MD;  Location: [facility] ORS;  Service: General;  Laterality: N/A;    Home Medications: Prior to Admission medications   Medication Sig Start Date End Date Taking? Authorizing Provider  albuterol (PROVENTIL HFA;VENTOLIN HFA) 108 (90 Base) MCG/ACT inhaler Inhale 2 puffs into the lungs every 4 (four) hours as needed for wheezing or shortness of breath. 11/22/16  Yes Cuthriell, Delorise Royals, PA-C  amoxicillin-clavulanate (AUGMENTIN) 875-125 MG tablet Take 1 tablet by mouth 2 (two) times daily for 14 days. 01/01/21 01/15/21 Yes Donovan Kail, PA-C  oxyCODONE (OXY IR/ROXICODONE) 5 MG immediate release tablet Take 1 tablet (5 mg total) by mouth every 6 (six)  hours as needed for severe pain or breakthrough pain (QHS). 01/01/21  Yes Donovan Kail, PA-C    Allergies: Allergies  Allergen Reactions   Ibuprofen Hives and Swelling    Review of Systems: Review of Systems  Constitutional:  Negative for chills and fever.  HENT:  Negative for hearing loss.   Respiratory:  Negative for shortness of breath.   Cardiovascular:  Negative for chest pain.  Gastrointestinal:  Negative for nausea and vomiting.  Genitourinary:  Negative for dysuria.  Musculoskeletal:  Negative for myalgias.  Skin:        Worsening pain and swelling and drainage in the gluteal cleft  Neurological:  Negative for dizziness.  Psychiatric/Behavioral:  Negative for depression.    Physical Exam BP 119/77   Pulse 88   Temp 98.7 F (37.1 C) (Oral)   Wt 168 lb (76.2 kg)   LMP 12/22/2020   BMI 28.84 kg/m  CONSTITUTIONAL: No acute distress, well-nourished HEENT:  Normocephalic, atraumatic, extraocular motion intact. NECK: Trachea is midline, no jugular venous distention. RESPIRATORY:  Normal respiratory effort without pathologic use of accessory muscles. CARDIOVASCULAR: Regular rhythm and rate. SKIN: The patient has a 3 x 2 cm area of induration, fluctuance, and erythema in the medial portion of the superior right buttocks just lateral to the cleft itself.  No active purulent drainage at this point but the patient is very tender and I am unable to do a thorough exam due to this. MUSCULOSKELETAL: Normal gait, no peripheral edema NEUROLOGIC:  Motor and sensation is grossly normal.  Cranial nerves are grossly intact. PSYCH:  Alert and oriented to person, place and time. Affect is normal.   Assessment and Plan: This is a 25 y.o. female with an infected pilonidal abscess.  - Discussed with patient that the pilonidal abscess is getting worse despite of the oral antibiotics.  In light of this, I think she does require incision and drainage procedure.  However the patient is  unable to tolerate this at bedside and would rather do this under anesthesia.  Discussed with her that we can schedule her for tomorrow afternoon for incision and drainage of pilonidal abscess.  The patient has had this done twice already by Dr. Juliann Pulse and Dr. Tonita Cong in 2015 and 2018 respectively.  Reviewed with her the procedure at length including risks of bleeding, infection, injury to surrounding structures.  Discussed with her and her mom the we would leave the wound open and she would require gauze dressing changes daily as in the previous surgeries.  This will be an outpatient procedure.  We will refill her prescription for oxycodone which was given last week and she is to continue her antibiotic for now.  We will obtain cultures in the operating room and adjust antibiotics as needed. - All questions have been answered.  She will be scheduled for tomorrow 01/07/2021 for I&D of pilonidal abscess.  I spent 40 minutes dedicated to the care of this patient on the date of this encounter to include pre-visit review of records, face-to-face time with the patient discussing diagnosis and management, and any post-visit coordination of care.   Howie Ill, MD [facility]

## 2021-01-06 NOTE — Telephone Encounter (Signed)
Patient has been advised of Pre-Admission date/time, COVID Testing date and Surgery date.  Surgery Date: 01/07/21  (Case was add on for following day), patient to arrive @ 12:00 pm  Preadmission Testing Date: 01/07/21 arrive 2 hours early, patient informed to arrive @ 12:00 and instructed not to eat or drink after midnight.   Covid Testing Date: Not needed.

## 2021-01-06 NOTE — Telephone Encounter (Signed)
Patient is calling and is asking if someone will give her a call patient is in pain. Please call patient and advise.

## 2021-01-06 NOTE — H&P (View-Only) (Signed)
01/06/2021  History of Present Illness: Paige Mitchell is a 25 y.o. female presenting for evaluation of infected pilonidal cyst.  She was last seen on 01/01/2021 with what was developing a pilonidal abscess.  She has a history of 2 prior I&D procedures in 2015 and 2018.  The patient was started on Augmentin at that point but the patient reports that over the weekend things started worsening and the area started becoming more swollen, more tender, and with some drainage of purulent fluid.  The patient's mother reports that is draining minimal amount right now as the opening is very small.  She called the office today and was scheduled for visit.  Patient denies any fevers, chills, chest pain, shortness of breath.  She has been taking the antibiotics prescribed as instructed.  Past Medical History: Past Medical History:  Diagnosis Date   Asthma      Past Surgical History: Past Surgical History:  Procedure Laterality Date   COLONOSCOPY WITH ESOPHAGOGASTRODUODENOSCOPY (EGD)  2014   [facility]   INCISE AND DRAIN ABCESS     Right Groin- Positive MRSA per patient   PILONIDAL CYST DRAINAGE  01/2014   Dr. Juliann Pulse   PILONIDAL CYST DRAINAGE N/A 03/30/2016   Procedure: IRRIGATION AND DEBRIDEMENT PILONIDAL CYST. Prone position;  Surgeon: Ricarda Frame, MD;  Location: [facility] ORS;  Service: General;  Laterality: N/A;    Home Medications: Prior to Admission medications   Medication Sig Start Date End Date Taking? Authorizing Provider  albuterol (PROVENTIL HFA;VENTOLIN HFA) 108 (90 Base) MCG/ACT inhaler Inhale 2 puffs into the lungs every 4 (four) hours as needed for wheezing or shortness of breath. 11/22/16  Yes Cuthriell, Delorise Royals, PA-C  amoxicillin-clavulanate (AUGMENTIN) 875-125 MG tablet Take 1 tablet by mouth 2 (two) times daily for 14 days. 01/01/21 01/15/21 Yes Donovan Kail, PA-C  oxyCODONE (OXY IR/ROXICODONE) 5 MG immediate release tablet Take 1 tablet (5 mg total) by mouth every 6 (six)  hours as needed for severe pain or breakthrough pain (QHS). 01/01/21  Yes Donovan Kail, PA-C    Allergies: Allergies  Allergen Reactions   Ibuprofen Hives and Swelling    Review of Systems: Review of Systems  Constitutional:  Negative for chills and fever.  HENT:  Negative for hearing loss.   Respiratory:  Negative for shortness of breath.   Cardiovascular:  Negative for chest pain.  Gastrointestinal:  Negative for nausea and vomiting.  Genitourinary:  Negative for dysuria.  Musculoskeletal:  Negative for myalgias.  Skin:        Worsening pain and swelling and drainage in the gluteal cleft  Neurological:  Negative for dizziness.  Psychiatric/Behavioral:  Negative for depression.    Physical Exam BP 119/77   Pulse 88   Temp 98.7 F (37.1 C) (Oral)   Wt 168 lb (76.2 kg)   LMP 12/22/2020   BMI 28.84 kg/m  CONSTITUTIONAL: No acute distress, well-nourished HEENT:  Normocephalic, atraumatic, extraocular motion intact. NECK: Trachea is midline, no jugular venous distention. RESPIRATORY:  Normal respiratory effort without pathologic use of accessory muscles. CARDIOVASCULAR: Regular rhythm and rate. SKIN: The patient has a 3 x 2 cm area of induration, fluctuance, and erythema in the medial portion of the superior right buttocks just lateral to the cleft itself.  No active purulent drainage at this point but the patient is very tender and I am unable to do a thorough exam due to this. MUSCULOSKELETAL: Normal gait, no peripheral edema NEUROLOGIC:  Motor and sensation is grossly normal.  Cranial nerves are grossly intact. PSYCH:  Alert and oriented to person, place and time. Affect is normal.   Assessment and Plan: This is a 25 y.o. female with an infected pilonidal abscess.  - Discussed with patient that the pilonidal abscess is getting worse despite of the oral antibiotics.  In light of this, I think she does require incision and drainage procedure.  However the patient is  unable to tolerate this at bedside and would rather do this under anesthesia.  Discussed with her that we can schedule her for tomorrow afternoon for incision and drainage of pilonidal abscess.  The patient has had this done twice already by Dr. Juliann Pulse and Dr. Tonita Cong in 2015 and 2018 respectively.  Reviewed with her the procedure at length including risks of bleeding, infection, injury to surrounding structures.  Discussed with her and her mom the we would leave the wound open and she would require gauze dressing changes daily as in the previous surgeries.  This will be an outpatient procedure.  We will refill her prescription for oxycodone which was given last week and she is to continue her antibiotic for now.  We will obtain cultures in the operating room and adjust antibiotics as needed. - All questions have been answered.  She will be scheduled for tomorrow 01/07/2021 for I&D of pilonidal abscess.  I spent 40 minutes dedicated to the care of this patient on the date of this encounter to include pre-visit review of records, face-to-face time with the patient discussing diagnosis and management, and any post-visit coordination of care.   Howie Ill, MD [facility]

## 2021-01-06 NOTE — Patient Instructions (Signed)
Our surgery scheduler Britta Mccreedy will call you within 24-48 hours to get you scheduled. If you have not heard from her after 48 hours, please call our office. You will not need to get Covid tested before surgery and have the blue sheet available when she calls to write down important information.   If you have any concerns or questions, please feel free to call our office.   Pilonidal Cyst Removal Pilonidal cyst removal is a procedure to remove a fluid-filled sac (cyst) that forms beneath the skin near the tailbone, at the top of the crease between the buttocks (pilonidal area). This procedure is also called a pilonidal cystectomy. An ingrown hair can cause irritation in this area. Then debris from skin cells can build up and form a cyst to seal off this irritation. Sometimes a tunnel (sinus) develops under the skin from the cyst and forms a second opening in the skin near the cyst. In this case, the sinus area may also be included in the removal procedure. You may need this procedure if you have a cyst that is large, causes pain, or keeps getting infected. For a pilonidal cyst that becomes infected, which is called an abscess, the infection will need to be treated before you have this procedure. You may first need to have an infected cyst opened, drained, and treated with antibiotic medicine before the cyst is removed. After the cyst is removed, the surgical incision used to remove the cyst may be closed with stitches (sutures) or left open. If the incision is left open, it may be packed with gauze. This usually requires daily bandage (dressing) changes. An open incision may take several weeks to heal. Tell your health care provider about: Any allergies you have. All medicines you are taking, including vitamins, herbs, eye drops, creams, and over-the-counter medicines. Any problems you or family members have had with anesthetic medicines. Any blood disorders you have. Any surgeries you have had. Any  medical conditions you have. Whether you are pregnant or may be pregnant. Any recent fever, increase in pain, or discharge from the cyst. What are the risks? Generally, this is a safe procedure. However, problems may occur, including: Delay in healing. This is the most common problem. Pain. Bleeding. Opening of a closed incision. Infection. The cyst coming back again (recurrence). What happens before the procedure? Staying hydrated Follow instructions from your health care provider about hydration, which may include: Up to 2 hours before the procedure - you may continue to drink clear liquids, such as water, clear fruit juice, black coffee, and plain tea. Eating and drinking restrictions Follow instructions from your health care provider about eating and drinking, which may include: 8 hours before the procedure - stop eating heavy meals or foods, such as meat, fried foods, or fatty foods. 6 hours before the procedure - stop eating light meals or foods, such as toast or cereal. 6 hours before the procedure - stop drinking milk or drinks that contain milk. 2 hours before the procedure - stop drinking clear liquids. Medicines Ask your health care provider about: Changing or stopping your regular medicines. This is especially important if you are taking diabetes medicines or blood thinners. Taking medicines such as aspirin and ibuprofen. These medicines can thin your blood. Do not take these medicines unless your health care provider tells you to take them. Taking over-the-counter medicines, vitamins, herbs, and supplements. General instructions Ask your health care provider what steps will be taken to help prevent infection. These steps may  hair at the surgery site. Washing skin with a germ-killing soap. Plan to have a responsible adult take you home from the hospital or clinic. Plan to have a responsible adult care for you for the time you are told after you leave the  hospital or clinic. This is important. What happens during the procedure?  An IV will be inserted into one of your veins. You will be given one or more of the following: A medicine to help you relax (sedative). A medicine to numb the area (local anesthetic). A medicine to make you fall asleep (general anesthetic). A medicine that is injected into your spine to numb the area below and slightly above the injection site (spinal anesthetic). Your surgeon will make an incision near the cyst. Depending on the size of the cyst and if the sinus is infected, one of the following will be done: A small hole will be made in the cyst abscess so that the pus can be drained. For a large or repeatedly infected sinus, one of these surgery methods may be used: The sinus will be cut out, and some surrounding skin will be removed. The wound will be left open to heal naturally. The sinus will be removed, and an oval-shaped flap of skin will be cut out on either side of it. The two sides will be sutured together. A thin, flexible tube with a camera on the end (endoscope) will be used to get a clear view of the affected area. Hair and infected tissue will be removed, and the sinus will be cleaned with a type of solution. Heat will be used to seal the sinus. Depending on whether the incision is left open or closed, one of the following will be done: An open incision may be packed with gauze and covered with a dressing. An incision may be closed with sutures and covered with a dressing. The area may be injected with fibrin glue and covered with a dressing. The procedure may vary among health care providers and hospitals. What happens after the procedure? Your blood pressure, heart rate, breathing rate, and blood oxygen level will be monitored until you leave the hospital or clinic. Your health care provider will give you instructions for taking care of your dressing at home after the procedure. If your incision was  left open and packed with gauze, daily dressing changes will be needed. You may be given a prescription for pain medicine and an antibiotic. Summary Pilonidal cyst removal is surgery to remove a fluid-filled sac (cyst) that forms in the crease between the buttocks. For this procedure, you may be given a local, general, or spinal anesthetic. Depending on the size of the cyst, the incision used to remove the cyst may be closed with stitches (sutures) or left open. If left open to heal, it may be packed with gauze and covered with a dressing. Your health care provider will give you instructions for taking care of your dressing at home. You may get a prescription for pain medicine and an antibiotic. This information is not intended to replace advice given to you by your health care provider. Make sure you discuss any questions you have with your healthcare provider. Document Revised: 03/14/2020 Document Reviewed: 03/14/2020 Elsevier Patient Education  2022 [employer] Inc[employer]  

## 2021-01-07 ENCOUNTER — Ambulatory Visit
Admission: RE | Admit: 2021-01-07 | Discharge: 2021-01-07 | Disposition: A | Discharge status: Home / Self Care | Payer: Medicaid Other | Attending: Surgery | Admitting: Surgery

## 2021-01-07 ENCOUNTER — Other Ambulatory Visit: Payer: Self-pay

## 2021-01-07 ENCOUNTER — Ambulatory Visit: Payer: Medicaid Other | Admitting: Anesthesiology

## 2021-01-07 ENCOUNTER — Encounter
Admission: RE | Disposition: A | Discharge status: Home / Self Care | Payer: Self-pay | Source: Home / Self Care | Attending: Surgery

## 2021-01-07 ENCOUNTER — Encounter: Payer: Self-pay | Admitting: Surgery

## 2021-01-07 DIAGNOSIS — Z79899 Other long term (current) drug therapy: Secondary | ICD-10-CM | POA: Insufficient documentation

## 2021-01-07 DIAGNOSIS — Z886 Allergy status to analgesic agent status: Secondary | ICD-10-CM | POA: Insufficient documentation

## 2021-01-07 DIAGNOSIS — Z7951 Long term (current) use of inhaled steroids: Secondary | ICD-10-CM | POA: Diagnosis not present

## 2021-01-07 DIAGNOSIS — L0501 Pilonidal cyst with abscess: Secondary | ICD-10-CM | POA: Insufficient documentation

## 2021-01-07 HISTORY — PX: PILONIDAL CYST DRAINAGE: SHX743

## 2021-01-07 LAB — POCT PREGNANCY, URINE: Preg Test, Ur: NEGATIVE

## 2021-01-07 SURGERY — EXCISION, PILONIDAL CYST
Anesthesia: General | Site: Buttocks

## 2021-01-07 SURGERY — INCISION AND DRAINAGE, ABSCESS
Anesthesia: General

## 2021-01-07 MED ORDER — PROMETHAZINE HCL 25 MG/ML IJ SOLN: 6.2500 mg | INTRAMUSCULAR | Status: DC | PRN

## 2021-01-07 MED ORDER — FENTANYL CITRATE (PF) 100 MCG/2ML IJ SOLN
INTRAMUSCULAR | Status: AC
  Filled 2021-01-07: qty 2

## 2021-01-07 MED ORDER — BUPIVACAINE LIPOSOME 1.3 % IJ SUSP: 20.0000 mL | Freq: Once | INTRAMUSCULAR | Status: DC

## 2021-01-07 MED ORDER — CHLORHEXIDINE GLUCONATE 0.12 % MT SOLN
OROMUCOSAL | Status: AC
  Administered 2021-01-07: 15 mL via OROMUCOSAL
  Filled 2021-01-07: qty 15

## 2021-01-07 MED ORDER — ROCURONIUM BROMIDE 100 MG/10ML IV SOLN
INTRAVENOUS | Status: DC | PRN
  Administered 2021-01-07: 40 mg via INTRAVENOUS

## 2021-01-07 MED ORDER — OXYCODONE HCL 5 MG PO TABS
ORAL_TABLET | ORAL | Status: AC
  Filled 2021-01-07: qty 1

## 2021-01-07 MED ORDER — MIDAZOLAM HCL 2 MG/2ML IJ SOLN
INTRAMUSCULAR | Status: DC | PRN
  Administered 2021-01-07: 2 mg via INTRAVENOUS

## 2021-01-07 MED ORDER — GABAPENTIN 300 MG PO CAPS
ORAL_CAPSULE | ORAL | Status: AC
  Administered 2021-01-07: 300 mg via ORAL
  Filled 2021-01-07: qty 1

## 2021-01-07 MED ORDER — LIDOCAINE HCL (CARDIAC) PF 100 MG/5ML IV SOSY
PREFILLED_SYRINGE | INTRAVENOUS | Status: DC | PRN
  Administered 2021-01-07: 80 mg via INTRAVENOUS

## 2021-01-07 MED ORDER — ORAL CARE MOUTH RINSE: 15.0000 mL | Freq: Once | OROMUCOSAL | Status: AC

## 2021-01-07 MED ORDER — ACETAMINOPHEN 10 MG/ML IV SOLN: 1000.0000 mg | Freq: Once | INTRAVENOUS | Status: DC | PRN

## 2021-01-07 MED ORDER — PROPOFOL 10 MG/ML IV BOLUS
INTRAVENOUS | Status: AC
  Filled 2021-01-07: qty 20

## 2021-01-07 MED ORDER — ONDANSETRON HCL 4 MG/2ML IJ SOLN
INTRAMUSCULAR | Status: DC | PRN
Start: 2021-01-07 — End: 2021-01-07
  Administered 2021-01-07: 4 mg via INTRAVENOUS

## 2021-01-07 MED ORDER — PROPOFOL 10 MG/ML IV BOLUS
INTRAVENOUS | Status: DC | PRN
  Administered 2021-01-07: 200 mg via INTRAVENOUS

## 2021-01-07 MED ORDER — BUPIVACAINE HCL (PF) 0.5 % IJ SOLN
INTRAMUSCULAR | Status: AC
  Filled 2021-01-07: qty 30

## 2021-01-07 MED ORDER — EPHEDRINE 5 MG/ML INJ
INTRAVENOUS | Status: AC
  Filled 2021-01-07: qty 5

## 2021-01-07 MED ORDER — ONDANSETRON HCL 4 MG/2ML IJ SOLN
INTRAMUSCULAR | Status: AC
  Filled 2021-01-07: qty 2

## 2021-01-07 MED ORDER — SUGAMMADEX SODIUM 200 MG/2ML IV SOLN
INTRAVENOUS | Status: DC | PRN
  Administered 2021-01-07: 200 mg via INTRAVENOUS

## 2021-01-07 MED ORDER — DEXAMETHASONE SODIUM PHOSPHATE 10 MG/ML IJ SOLN
INTRAMUSCULAR | Status: AC
  Filled 2021-01-07: qty 1

## 2021-01-07 MED ORDER — FENTANYL CITRATE (PF) 100 MCG/2ML IJ SOLN: 25.0000 ug | INTRAMUSCULAR | Status: DC | PRN

## 2021-01-07 MED ORDER — BUPIVACAINE LIPOSOME 1.3 % IJ SUSP
INTRAMUSCULAR | Status: AC
  Filled 2021-01-07: qty 10

## 2021-01-07 MED ORDER — MIDAZOLAM HCL 2 MG/2ML IJ SOLN
INTRAMUSCULAR | Status: AC
  Filled 2021-01-07: qty 2

## 2021-01-07 MED ORDER — OXYCODONE HCL 5 MG/5ML PO SOLN
5.0000 mg | Freq: Once | ORAL | Status: AC | PRN
Start: 2021-01-07 — End: 2021-01-07

## 2021-01-07 MED ORDER — SUGAMMADEX SODIUM 500 MG/5ML IV SOLN
INTRAVENOUS | Status: AC
  Filled 2021-01-07: qty 5

## 2021-01-07 MED ORDER — CEFAZOLIN SODIUM-DEXTROSE 2-4 GM/100ML-% IV SOLN
INTRAVENOUS | Status: AC
  Filled 2021-01-07: qty 100

## 2021-01-07 MED ORDER — ROCURONIUM BROMIDE 10 MG/ML (PF) SYRINGE
PREFILLED_SYRINGE | INTRAVENOUS | Status: AC
  Filled 2021-01-07: qty 10

## 2021-01-07 MED ORDER — GABAPENTIN 300 MG PO CAPS
300.0000 mg | ORAL_CAPSULE | ORAL | Status: AC
Start: 2021-01-07 — End: 2021-01-07

## 2021-01-07 MED ORDER — OXYCODONE HCL 5 MG PO TABS
5.0000 mg | ORAL_TABLET | Freq: Once | ORAL | Status: AC | PRN
Start: 2021-01-07 — End: 2021-01-07
  Administered 2021-01-07: 5 mg via ORAL

## 2021-01-07 MED ORDER — CHLORHEXIDINE GLUCONATE CLOTH 2 % EX PADS: 6.0000 | MEDICATED_PAD | Freq: Once | CUTANEOUS | Status: DC

## 2021-01-07 MED ORDER — FENTANYL CITRATE (PF) 100 MCG/2ML IJ SOLN
INTRAMUSCULAR | Status: DC | PRN
  Administered 2021-01-07: 100 ug via INTRAVENOUS

## 2021-01-07 MED ORDER — CHLORHEXIDINE GLUCONATE 0.12 % MT SOLN: 15.0000 mL | Freq: Once | OROMUCOSAL | Status: AC

## 2021-01-07 MED ORDER — CEFAZOLIN SODIUM-DEXTROSE 2-4 GM/100ML-% IV SOLN
2.0000 g | INTRAVENOUS | Status: AC
  Administered 2021-01-07: 2 g via INTRAVENOUS

## 2021-01-07 MED ORDER — ACETAMINOPHEN 500 MG PO TABS
ORAL_TABLET | ORAL | Status: AC
  Administered 2021-01-07: 1000 mg via ORAL
  Filled 2021-01-07: qty 2

## 2021-01-07 MED ORDER — DEXAMETHASONE SODIUM PHOSPHATE 10 MG/ML IJ SOLN
INTRAMUSCULAR | Status: DC | PRN
  Administered 2021-01-07: 10 mg via INTRAVENOUS

## 2021-01-07 MED ORDER — LIDOCAINE HCL (PF) 2 % IJ SOLN
INTRAMUSCULAR | Status: AC
  Filled 2021-01-07: qty 5

## 2021-01-07 MED ORDER — CHLORHEXIDINE GLUCONATE CLOTH 2 % EX PADS
6.0000 | MEDICATED_PAD | Freq: Once | CUTANEOUS | Status: AC
  Administered 2021-01-07: 6 via TOPICAL

## 2021-01-07 MED ORDER — ACETAMINOPHEN 500 MG PO TABS: 1000.0000 mg | ORAL_TABLET | ORAL | Status: AC

## 2021-01-07 MED ORDER — BUPIVACAINE LIPOSOME 1.3 % IJ SUSP
INTRAMUSCULAR | Status: DC | PRN
  Administered 2021-01-07: 40 mL via SURGICAL_CAVITY

## 2021-01-07 MED ORDER — LACTATED RINGERS IV SOLN: INTRAVENOUS | Status: DC

## 2021-01-07 SURGICAL SUPPLY — 40 items
BLADE SURG 15 STRL LF DISP TIS (BLADE) ×1 IMPLANT
BLADE SURG 15 STRL SS (BLADE) ×2
BLADE SURG SZ11 CARB STEEL (BLADE) ×2 IMPLANT
BRIEF STRETCH FOR OB PAD XXL (UNDERPADS AND DIAPERS) IMPLANT
DRAPE LAPAROTOMY 100X77 ABD (DRAPES) ×2 IMPLANT
ELECT CAUTERY BLADE TIP 2.5 (TIP) ×2
ELECT REM PT RETURN 9FT ADLT (ELECTROSURGICAL) ×2
ELECTRODE CAUTERY BLDE TIP 2.5 (TIP) ×1 IMPLANT
ELECTRODE REM PT RTRN 9FT ADLT (ELECTROSURGICAL) ×1 IMPLANT
GAUZE 4X4 16PLY ~~LOC~~+RFID DBL (SPONGE) ×2 IMPLANT
GAUZE PACKING IODOFORM 1/2 (PACKING) ×2 IMPLANT
GAUZE SPONGE 4X4 12PLY STRL (GAUZE/BANDAGES/DRESSINGS) ×2 IMPLANT
GLOVE SURG SYN 7.0 (GLOVE) ×2 IMPLANT
GLOVE SURG SYN 7.5  E (GLOVE) ×1
GLOVE SURG SYN 7.5 E (GLOVE) ×1 IMPLANT
GOWN STRL REUS W/ TWL LRG LVL3 (GOWN DISPOSABLE) ×4 IMPLANT
GOWN STRL REUS W/TWL LRG LVL3 (GOWN DISPOSABLE) ×8
IV CATH ANGIO 14GX1.88 NO SAFE (IV SOLUTION) IMPLANT
KIT TURNOVER KIT A (KITS) ×2 IMPLANT
LABEL OR SOLS (LABEL) ×2 IMPLANT
MANIFOLD NEPTUNE II (INSTRUMENTS) ×2 IMPLANT
NEEDLE HYPO 22GX1.5 SAFETY (NEEDLE) ×2 IMPLANT
NS IRRIG 500ML POUR BTL (IV SOLUTION) ×2 IMPLANT
PACK BASIN MINOR ARMC (MISCELLANEOUS) ×2 IMPLANT
PAD ABD DERMACEA PRESS 5X9 (GAUZE/BANDAGES/DRESSINGS) ×2 IMPLANT
PUNCH BIOPSY 3 (MISCELLANEOUS) IMPLANT
PUNCH BIOPSY 4MM (MISCELLANEOUS)
PUNCH BIOPSY DERMAL 6MM STRL (MISCELLANEOUS) IMPLANT
PUNCH BIOPSY DISP 4 (MISCELLANEOUS) IMPLANT
SOL PREP PVP 2OZ (MISCELLANEOUS) ×2
SOLUTION PREP PVP 2OZ (MISCELLANEOUS) ×1 IMPLANT
SUT ETHILON 2 0 FS 18 (SUTURE) IMPLANT
SUT MNCRL 4-0 (SUTURE)
SUT MNCRL 4-0 27XMFL (SUTURE)
SUT VIC AB 3-0 SH 27 (SUTURE) ×2
SUT VIC AB 3-0 SH 27X BRD (SUTURE) ×1 IMPLANT
SUTURE MNCRL 4-0 27XMF (SUTURE) IMPLANT
SYR 20ML LL LF (SYRINGE) ×2 IMPLANT
SYR BULB IRRIG 60ML STRL (SYRINGE) ×2 IMPLANT
WATER STERILE IRR 500ML POUR (IV SOLUTION) ×2 IMPLANT

## 2021-01-07 NOTE — Anesthesia Preprocedure Evaluation (Addendum)
Anesthesia Evaluation  Patient identified by MRN, date of birth, ID band Patient awake    Reviewed: Allergy & Precautions, NPO status , Patient's Chart, lab work & pertinent test results  Airway Mallampati: II       Dental no notable dental hx.    Pulmonary asthma , Current SmokerPatient did not abstain from smoking.,    Pulmonary exam normal        Cardiovascular negative cardio ROS Normal cardiovascular exam     Neuro/Psych negative neurological ROS  negative psych ROS   GI/Hepatic negative GI ROS, Neg liver ROS,   Endo/Other  negative endocrine ROS  Renal/GU negative Renal ROS  negative genitourinary   Musculoskeletal negative musculoskeletal ROS (+)   Abdominal Normal abdominal exam  (+)   Peds negative pediatric ROS (+)  Hematology negative hematology ROS (+)   Anesthesia Other Findings Infected pilonidal abscess  Reproductive/Obstetrics negative OB ROS                            Anesthesia Physical Anesthesia Plan  ASA: 2  Anesthesia Plan: General   Post-op Pain Management:    Induction: Intravenous  PONV Risk Score and Plan: 3 and Ondansetron, Dexamethasone and Midazolam  Airway Management Planned: Oral ETT  Additional Equipment:   Intra-op Plan:   Post-operative Plan: Extubation in OR  Informed Consent: I have reviewed the patients History and Physical, chart, labs and discussed the procedure including the risks, benefits and alternatives for the proposed anesthesia with the patient or authorized representative who has indicated his/her understanding and acceptance.     Dental advisory given  Plan Discussed with: CRNA and Anesthesiologist  Anesthesia Plan Comments:         Anesthesia Quick Evaluation

## 2021-01-07 NOTE — Anesthesia Procedure Notes (Signed)
Procedure Name: Intubation Date/Time: 01/07/2021 2:33 PM Performed by: Clyde Lundborg, CRNA Pre-anesthesia Checklist: Patient identified, Emergency Drugs available, Suction available and Patient being monitored Patient Re-evaluated:Patient Re-evaluated prior to induction Oxygen Delivery Method: Circle system utilized Preoxygenation: Pre-oxygenation with 100% oxygen Induction Type: IV induction Ventilation: Mask ventilation without difficulty Laryngoscope Size: McGraph and 3 Grade View: Grade I Tube type: Oral Tube size: 7.0 mm Number of attempts: 1 Airway Equipment and Method: Video-laryngoscopy Placement Confirmation: ETT inserted through vocal cords under direct vision, positive ETCO2, breath sounds checked- equal and bilateral and CO2 detector Secured at: 21 cm Tube secured with: Tape Dental Injury: Teeth and Oropharynx as per pre-operative assessment

## 2021-01-07 NOTE — Transfer of Care (Signed)
Immediate Anesthesia Transfer of Care Note  Patient: Paige Mitchell  Procedure(s) Performed: IRRIGATION AND DEBRIDEMENT PILONIDAL CYST (Buttocks)  Patient Location: PACU  Anesthesia Type:General  Level of Consciousness: awake, alert  and oriented  Airway & Oxygen Therapy: Patient Spontanous Breathing  Post-op Assessment: Report given to RN and Post -op Vital signs reviewed and stable  Post vital signs: Reviewed and stable  Last Vitals:  Vitals Value Taken Time  BP    Temp    Pulse    Resp    SpO2      Last Pain:  Vitals:   01/07/21 1237  TempSrc: Oral  PainSc: 0-No pain         Complications: No notable events documented.

## 2021-01-07 NOTE — Op Note (Signed)
  Procedure Date:  01/07/2021  Pre-operative Diagnosis:  Pilonidal abscess  Post-operative Diagnosis: Pilonidal abscess  Procedure:  Incision and drainage of pilonidal abscess  Surgeon:  Howie Ill, MD  Assistant:  Margarita Rana, PA-S  Anesthesia:  General endotracheal  Estimated Blood Loss:  10 ml  Specimens:  culture swab  Complications:  None  Indications for Procedure:  This is a 25 y.o. female with a pilonidal abscess, now her third episode since 35.  She was started on oral antibiotic course last week but the abscess has worsened and now presents for I&D. The risks of bleeding, infection, recurrence of symptoms, abscess or infection, were all discussed with the patient and she was willing to proceed.  Description of Procedure: The patient was correctly identified in the preoperative area and brought into the operating room.  The patient was placed supine with VTE prophylaxis in place.  Appropriate time-outs were performed.  Anesthesia was induced and the patient was intubated.  The patient was then placed in prone position. Appropriate antibiotics were infused.  The gluteal cleft area was prepped and draped in usual sterile fashion.  A 2.5 cm elliptical incision was made over the abscess, revealing purulent fluid.  This was swabbed and sent for cultures.  Yankauer was then used to suction any remaining fluid and to bluntly dissect to check for any remaining loculations within the abscess cavity.  The wound was then thoroughly irrigated with saline.  Some cyst wall component was able to be resected using cautery, but due to significant inflammation, it was unclear what was inflammation/scar and what was cyst.  Hemostasis was obtained with cautery.  40 ml of Exparel solution mixed with 0.5% bupivacaine was infiltrated onto the wound layers.  The cavity was packed with 1/2 inch iodoform gauze, dressed with 4x4 gauze and ABD pad, and secured with tape.  The patient was then  placed back on supine position, emerged from anesthesia, extubated, and brought to the recovery room for further management.  The patient tolerated the procedure well and all counts were correct at the end of the case.   Howie Ill, MD

## 2021-01-07 NOTE — Discharge Instructions (Signed)
AMBULATORY SURGERY  ?DISCHARGE INSTRUCTIONS ? ? ?The drugs that you were given will stay in your system until tomorrow so for the next 24 hours you should not: ? ?Drive an automobile ?Make any legal decisions ?Drink any alcoholic beverage ? ? ?You may resume regular meals tomorrow.  Today it is better to start with liquids and gradually work up to solid foods. ? ?You may eat anything you prefer, but it is better to start with liquids, then soup and crackers, and gradually work up to solid foods. ? ? ?Please notify your doctor immediately if you have any unusual bleeding, trouble breathing, redness and pain at the surgery site, drainage, fever, or pain not relieved by medication. ? ? ? ?Additional Instructions: ? ? ? ?Please contact your physician with any problems or Same Day Surgery at [phone], Monday through Friday 6 am to 4 pm, or [place] [facility] [place] [facility] number at [phone].  ?

## 2021-01-07 NOTE — Interval H&P Note (Signed)
History and Physical Interval Note:  01/07/2021 2:01 PM  Paige Mitchell  has presented today for surgery, with the diagnosis of Infected pilonidal abscess.  The various methods of treatment have been discussed with the patient and family. After consideration of risks, benefits and other options for treatment, the patient has consented to  Procedure(s) with comments: IRRIGATION AND DEBRIDEMENT PILONIDAL CYST (N/A) - Prone position as a surgical intervention.  The patient's history has been reviewed, patient examined, no change in status, stable for surgery.  I have reviewed the patient's chart and labs.  Questions were answered to the patient's satisfaction.     Bobak Oguinn

## 2021-01-08 ENCOUNTER — Encounter: Payer: Self-pay | Admitting: Surgery

## 2021-01-08 NOTE — Anesthesia Postprocedure Evaluation (Addendum)
Anesthesia Post Note  Patient: Paige Mitchell  Procedure(s) Performed: IRRIGATION AND DEBRIDEMENT PILONIDAL CYST (Buttocks)  Patient location during evaluation: PACU Anesthesia Type: General Level of consciousness: awake and alert Pain management: pain level controlled Vital Signs Assessment: post-procedure vital signs reviewed and stable Respiratory status: spontaneous breathing, nonlabored ventilation and respiratory function stable Cardiovascular status: blood pressure returned to baseline and stable Postop Assessment: no apparent nausea or vomiting Anesthetic complications: no   No notable events documented.   Last Vitals:  Vitals:   01/07/21 1610 01/07/21 1628  BP:  114/74  Pulse: (!) 58 63  Resp: 15 14  Temp: 36.4 C (!) 36.4 C  SpO2: (!) 84% 100%    Last Pain:  Vitals:   01/07/21 1628  TempSrc: Temporal  PainSc: 7                  Foye Deer

## 2021-01-10 ENCOUNTER — Ambulatory Visit: Payer: Medicaid Other | Admitting: Surgery

## 2021-01-14 ENCOUNTER — Other Ambulatory Visit: Payer: Self-pay

## 2021-01-14 ENCOUNTER — Encounter: Payer: Self-pay | Admitting: Physician Assistant

## 2021-01-14 ENCOUNTER — Ambulatory Visit (INDEPENDENT_AMBULATORY_CARE_PROVIDER_SITE_OTHER): Payer: Medicaid Other | Admitting: Physician Assistant

## 2021-01-14 VITALS — BP 116/48 | HR 86 | Temp 98.3°F | Ht 65.0 in | Wt 172.0 lb

## 2021-01-14 DIAGNOSIS — L0501 Pilonidal cyst with abscess: Secondary | ICD-10-CM

## 2021-01-14 DIAGNOSIS — Z09 Encounter for follow-up examination after completed treatment for conditions other than malignant neoplasm: Secondary | ICD-10-CM

## 2021-01-14 LAB — AEROBIC/ANAEROBIC CULTURE W GRAM STAIN (SURGICAL/DEEP WOUND): Gram Stain: NONE SEEN

## 2021-01-14 MED ORDER — OXYCODONE HCL 5 MG PO TABS: 5.0000 mg | ORAL_TABLET | Freq: Four times a day (QID) | ORAL | 0 refills | Status: DC | PRN

## 2021-01-14 NOTE — Progress Notes (Signed)
[facility] POST-OP OFFICE VISIT  01/14/2021  HPI: Paige Mitchell is a 25 y.o. female 7 days s/p incision and drainage or pilonidal abscess with Dr Aleen Campi.   She has been doing well at home, she had a rough day yesterday with pain but otherwise is only needing 1-2 oxycodone a day with dressing changes No fever or chills Discharged home on Augmentin.Cx from the OR without growth   Vital signs: BP (!) 116/48   Pulse 86   Temp 98.3 F (36.8 C) (Other (Comment))   Ht 5\' 5"  (1.651 m)   Wt 172 lb (78 kg)   LMP 12/22/2020 Comment: Neg pregnancy test 01/07/2021  SpO2 97%   BMI 28.62 kg/m    Physical Exam: Constitutional: Well appearing female, NAD Skin: 2.5 incision to the right gluteal crease just inferior to the sacrum, packing removed with minimal fibrinous drainage, wound bed is starting to granulate, no erythema.   Assessment/Plan: This is a 25 y.o. female 7 days s/p incision and drainage or pilonidal abscess   - Continue local wound care: pack daily, cover, secure. Supplies were given  - I will refill her pain medication for dressing changes  - No further need for Abx  - I will see her again in ~2 weeks for wound check   -- 30, PA-C [facility] 01/14/2021, 10:48 AM [phone] M-F: 7am - 4pm

## 2021-01-14 NOTE — Patient Instructions (Signed)
Please continue to pack the wound daily.

## 2021-01-16 ENCOUNTER — Telehealth: Payer: Self-pay | Admitting: Surgery

## 2021-01-16 ENCOUNTER — Other Ambulatory Visit: Payer: Self-pay | Admitting: Physician Assistant

## 2021-01-16 MED ORDER — OXYCODONE HCL 5 MG PO TABS: 5.0000 mg | ORAL_TABLET | Freq: Four times a day (QID) | ORAL | 0 refills | Status: DC | PRN

## 2021-01-16 NOTE — Telephone Encounter (Signed)
Pt's mom, Ludger Nutting, called advising the pt has not been able to get the pain rx filled since Tues because the pharmacy it was sent to [facility] in [place]) does not have it in stock.  Ludger Nutting has contacted [facility] ([address] - [place]) & confirmed they have the rx in stock.  She is aware a msg will be sent to the PA to send a new rx.  Thank you

## 2021-01-27 ENCOUNTER — Encounter: Payer: Self-pay | Admitting: Physician Assistant

## 2021-01-27 ENCOUNTER — Other Ambulatory Visit: Payer: Self-pay

## 2021-01-27 ENCOUNTER — Ambulatory Visit (LOCAL_COMMUNITY_HEALTH_CENTER): Payer: Medicaid Other | Admitting: Physician Assistant

## 2021-01-27 VITALS — BP 120/79 | Ht 63.5 in | Wt 174.2 lb

## 2021-01-27 DIAGNOSIS — Z30013 Encounter for initial prescription of injectable contraceptive: Secondary | ICD-10-CM | POA: Diagnosis not present

## 2021-01-27 DIAGNOSIS — Z01419 Encounter for gynecological examination (general) (routine) without abnormal findings: Secondary | ICD-10-CM

## 2021-01-27 DIAGNOSIS — Z3202 Encounter for pregnancy test, result negative: Secondary | ICD-10-CM

## 2021-01-27 DIAGNOSIS — Z124 Encounter for screening for malignant neoplasm of cervix: Secondary | ICD-10-CM

## 2021-01-27 DIAGNOSIS — Z113 Encounter for screening for infections with a predominantly sexual mode of transmission: Secondary | ICD-10-CM

## 2021-01-27 DIAGNOSIS — Z3009 Encounter for other general counseling and advice on contraception: Secondary | ICD-10-CM

## 2021-01-27 LAB — PREGNANCY, URINE: Preg Test, Ur: NEGATIVE

## 2021-01-27 LAB — HM HIV SCREENING LAB: HM HIV Screening: NEGATIVE

## 2021-01-27 LAB — HM HEPATITIS C SCREENING LAB: HM Hepatitis Screen: NEGATIVE

## 2021-01-27 MED ORDER — MEDROXYPROGESTERONE ACETATE 150 MG/ML IM SUSP
150.0000 mg | INTRAMUSCULAR | Status: AC
  Administered 2021-01-27 – 2021-06-30 (×3): 150 mg via INTRAMUSCULAR

## 2021-01-27 NOTE — Progress Notes (Signed)
Patient here for PE, pap, STD testing and BC. Patient was given depo shot in R deltoid. Appt reminder card given and dated for 04/14/2021. PCP list given to patient.

## 2021-01-28 ENCOUNTER — Encounter: Payer: Self-pay | Admitting: Physician Assistant

## 2021-01-28 LAB — PAP IG (IMAGE GUIDED): PAP Smear Comment: 0

## 2021-01-28 NOTE — Progress Notes (Signed)
[facility] DEPARTMENT [facility] [address] Main Number: [phone]    Family Planning Visit- Initial Visit  Subjective:  Paige Mitchell is a 25 y.o.  [id number]   being seen today for an initial annual visit and to discuss contraceptive options.  The patient is currently using None for pregnancy prevention. Patient reports she does not want a pregnancy in the next year.  Patient has the following medical conditions has Pilonidal abscess on their problem list.  Chief Complaint  Patient presents with   Contraception    Annual exam    Patient reports that she would like to restart Depo as her BCM.  States that she was previously on Depo without problems.  States that she has had "random" nausea for the last 2 weeks or so since her surgery.  Reports that she had a negative PT at home about 1 week ago.  Per chart review, CBE and pap are due today.   Patient denies any other concerns.    Body mass index is 30.37 kg/m. - Patient is eligible for diabetes screening based on BMI and age >34?  not applicable HA1C ordered? not applicable  Patient reports 1  partner/s in last year. Desires STI screening?  Yes  Has patient been screened once for HCV in the past?  No  No results found for: HCVAB  Does the patient have current drug use (including MJ), have a partner with drug use, and/or has been incarcerated since last result? No  If yes-- Screen for HCV through [facility]   Does the patient meet criteria for HBV testing? No  Criteria:  -Household, sexual or needle sharing contact with HBV -History of drug use -HIV positive -Those with known Hep C   Health Maintenance Due  Topic Date Due   COVID-19 Vaccine (1) Never done   HPV VACCINES (1 - 2-dose series) Never done   HIV Screening  Never done   Hepatitis C Screening  Never done   TETANUS/TDAP  Never done   PAP-Cervical Cytology Screening  Never done   PAP SMEAR-Modifier  Never done    INFLUENZA VACCINE  Never done    Review of Systems  All other systems reviewed and are negative.  The following portions of the patient's history were reviewed and updated as appropriate: allergies, current medications, past family history, past medical history, past social history, past surgical history and problem list. Problem list updated.   See flowsheet for other program required questions.  Objective:   Vitals:   01/27/21 0937  BP: 120/79  Weight: 174 lb 3.2 oz (79 kg)  Height: 5' 3.5" (1.613 m)    Physical Exam Vitals and nursing note reviewed.  Constitutional:      General: She is not in acute distress.    Appearance: Normal appearance.  HENT:     Head: Normocephalic and atraumatic.     Mouth/Throat:     Mouth: Mucous membranes are moist.     Pharynx: Oropharynx is clear. No oropharyngeal exudate or posterior oropharyngeal erythema.  Eyes:     Conjunctiva/sclera: Conjunctivae normal.  Neck:     Thyroid: No thyroid mass, thyromegaly or thyroid tenderness.  Cardiovascular:     Rate and Rhythm: Normal rate and regular rhythm.  Pulmonary:     Effort: Pulmonary effort is normal.     Breath sounds: Normal breath sounds.  Chest:  Breasts:    Right: Normal. No mass, nipple discharge, skin change or  tenderness.     Left: Normal. No mass, nipple discharge, skin change or tenderness.  Abdominal:     Palpations: Abdomen is soft. There is no mass.     Tenderness: There is no abdominal tenderness. There is no guarding or rebound.  Genitourinary:    General: Normal vulva.     Rectum: Normal.     Comments: External genitalia/pubic area without nits, lice, edema, erythema, lesions and inguinal adenopathy. Vagina with normal mucosa and discharge. Cervix without visible lesions. Uterus firm, mobile, nt, no masses, no CMT, no adnexal tenderness or fullness.  Musculoskeletal:     Cervical back: Neck supple. No tenderness.  Lymphadenopathy:     Cervical: No cervical  adenopathy.     Upper Body:     Right upper body: No supraclavicular, axillary or pectoral adenopathy.     Left upper body: No supraclavicular, axillary or pectoral adenopathy.  Skin:    General: Skin is warm and dry.     Findings: No bruising, erythema, lesion or rash.  Neurological:     Mental Status: She is alert and oriented to person, place, and time.  Psychiatric:        Mood and Affect: Mood normal.        Behavior: Behavior normal.        Thought Content: Thought content normal.        Judgment: Judgment normal.      Assessment and Plan:  Paige Mitchell is a 25 y.o. female presenting to the [facility] Department for an initial annual wellness/contraceptive visit  Contraception counseling: Reviewed all forms of birth control options in the tiered based approach. available including abstinence; over the counter/barrier methods; hormonal contraceptive medication including pill, patch, ring, injection,contraceptive implant, ECP; hormonal and nonhormonal IUDs; permanent sterilization options including vasectomy and the various tubal sterilization modalities. Risks, benefits, and typical effectiveness rates were reviewed.  Questions were answered.  Written information was also given to the patient to review.  Patient desires to restart Depo, this was prescribed for patient. She will follow up in  3 months and prn for surveillance.  She was told to call with any further questions, or with any concerns about this method of contraception.  Emphasized use of condoms 100% of the time for STI prevention.  Patient was offered ECP based on unprotected sex within the last 5 days. ECP was not accepted by the patient. ECP counseling was given.  1. Encounter for counseling regarding contraception Reviewed with patient as above re: BCM options. Reviewed with patient normal SE of Depo and when to call clinic with concerns. Counseled patient that she would need to use condoms after Depo  today and recheck a pregnancy test in 2 weeks.  If PT is positive, patient to RTC for repeat pregnancy test and counseling.  Enc condoms with all sex for STD protection.   2. Screening for STD (sexually transmitted disease) Await test results.  Counseled patient that RN will call if needs to RTC for treatment once results are back.   - Chlamydia/Gonorrhea [place] [facility] - HIV/HCV [facility] - Syphilis Serology, [place] [facility]  3. Well woman exam with routine gynecological exam Reviewed with patient healthy habits to maintain general health. Enc MVI 1 po daily. Enc to establish with/ follow up with PCP for primary care concerns, age appropriate screenings and illness.  Pregnancy test was negative today.  - Pregnancy, urine  4. Routine Papanicolaou smear Await pap results.  Counseled patient  that RN will call or send letter once results are back with follow up plan/recommendations.  - Pap IG (Image Guided)  5. Initiation of Depo Provera If pregnancy test is negative, OK for Depo 150 mg IM q 11-13 weeks for 1 year. Condoms as back up for 2 weeks and check pregnancy test in 2 weeks. - medroxyPROGESTERone (DEPO-PROVERA) injection 150 mg     Return in about 11 weeks (around 04/14/2021) for Depo.  Future Appointments  Date Time Provider Department Center  01/29/2021 10:45 AM Henrene Dodge, MD [facility] None    Matt Holmes, [place]

## 2021-01-29 ENCOUNTER — Ambulatory Visit (INDEPENDENT_AMBULATORY_CARE_PROVIDER_SITE_OTHER): Payer: Medicaid Other | Admitting: Surgery

## 2021-01-29 ENCOUNTER — Encounter: Payer: Self-pay | Admitting: Surgery

## 2021-01-29 ENCOUNTER — Other Ambulatory Visit: Payer: Self-pay

## 2021-01-29 VITALS — BP 109/70 | HR 95 | Temp 98.6°F | Ht 63.5 in | Wt 172.6 lb

## 2021-01-29 DIAGNOSIS — Z09 Encounter for follow-up examination after completed treatment for conditions other than malignant neoplasm: Secondary | ICD-10-CM | POA: Diagnosis not present

## 2021-01-29 DIAGNOSIS — L0501 Pilonidal cyst with abscess: Secondary | ICD-10-CM | POA: Diagnosis not present

## 2021-01-29 NOTE — Patient Instructions (Addendum)
Change the Band aid or gauze 1-2 times a day as needed.   Follow-up with our office as needed.  Please call and ask to speak with a nurse if you develop questions or concerns.   If the area flairs up again call us so we can start you on antibiotics.     Call us once you would like to have this area fully excised in the OR. We will schedule you to come in to see Dr Aleen Campi to discuss and schedule this.

## 2021-01-29 NOTE — Progress Notes (Signed)
01/29/2021  HPI: AMILIA VANDENBRINK is a 25 y.o. female s/p I&D of pilonidal abscess on 01/07/21.  Patient presents for follow up.  She has been doing well and no longer has to do any wound packing for the last 4 days.  Has been applying a BandAid over the wound.  Vital signs: BP 109/70   Pulse 95   Temp 98.6 F (37 C) (Oral)   Ht 5' 3.5" (1.613 m)   Wt 172 lb 9.6 oz (78.3 kg)   LMP 01/12/2021 (Approximate) Comment: Abnormal  SpO2 99%   BMI 30.10 kg/m    Physical Exam: Constitutional: No acute distress Skin:  Right medial buttocks wound is almost healed, with a small punctate wound.  No active drainage.  Applied new BandAid.  Assessment/Plan: This is a 25 y.o. female s/p I&D of pilonidal abscess  --Instructed patient to continue BandAids until wound is fully healed.  Change daily or as needed to keep the area clean, dry. --Discussed with patient further about option for going back to the OR in the future for formal resection of the area, given that this is her third episode of an abscess.  She will look at her work calendar to figure out when this would be better timing and she will contact us to set up an appointment. --Otherwise follow up as needed.   Howie Ill, MD [facility]

## 2021-02-19 ENCOUNTER — Other Ambulatory Visit: Payer: Self-pay

## 2021-02-19 ENCOUNTER — Encounter: Payer: Self-pay | Admitting: Emergency Medicine

## 2021-02-19 ENCOUNTER — Emergency Department
Admission: EM | Admit: 2021-02-19 | Discharge: 2021-02-19 | Disposition: A | Discharge status: Home / Self Care | Payer: Medicaid Other | Attending: Emergency Medicine | Admitting: Emergency Medicine

## 2021-02-19 DIAGNOSIS — Z20822 Contact with and (suspected) exposure to covid-19: Secondary | ICD-10-CM | POA: Insufficient documentation

## 2021-02-19 DIAGNOSIS — Z87891 Personal history of nicotine dependence: Secondary | ICD-10-CM | POA: Insufficient documentation

## 2021-02-19 DIAGNOSIS — J069 Acute upper respiratory infection, unspecified: Secondary | ICD-10-CM | POA: Diagnosis not present

## 2021-02-19 DIAGNOSIS — J45909 Unspecified asthma, uncomplicated: Secondary | ICD-10-CM | POA: Diagnosis not present

## 2021-02-19 DIAGNOSIS — R059 Cough, unspecified: Secondary | ICD-10-CM | POA: Diagnosis present

## 2021-02-19 LAB — URINALYSIS, COMPLETE (UACMP) WITH MICROSCOPIC
Bacteria, UA: NONE SEEN
Bilirubin Urine: NEGATIVE
Glucose, UA: NEGATIVE mg/dL
Ketones, ur: NEGATIVE mg/dL
Nitrite: NEGATIVE
Protein, ur: NEGATIVE mg/dL
Specific Gravity, Urine: 1.025 (ref 1.005–1.030)
pH: 6.5 (ref 5.0–8.0)

## 2021-02-19 LAB — RESP PANEL BY RT-PCR (FLU A&B, COVID) ARPGX2
Influenza A by PCR: NEGATIVE
Influenza B by PCR: NEGATIVE
SARS Coronavirus 2 by RT PCR: NEGATIVE

## 2021-02-19 LAB — POC URINE PREG, ED: Preg Test, Ur: NEGATIVE

## 2021-02-19 NOTE — ED Triage Notes (Signed)
Cough, body aches, sore throat, ear pain, chills x 3 days.

## 2021-02-19 NOTE — ED Provider Notes (Signed)
[facility] Emergency Department Provider Note   ____________________________________________    I have reviewed the triage vital signs and the nursing notes.   HISTORY  Chief Complaint URI     HPI Paige Mitchell is a 25 y.o. female who presents with complaints of congestion, mild sore throat, fatigue, chills, myalgias  Symptoms been ongoing for 2 to 3 days now.  Denies shortness of breath.  Occasional cough  Has not been vaccinated against COVID-19  Past Medical History:  Diagnosis Date   Asthma     Patient Active Problem List   Diagnosis Date Noted   Pilonidal abscess     Past Surgical History:  Procedure Laterality Date   COLONOSCOPY WITH ESOPHAGOGASTRODUODENOSCOPY (EGD)  2014   [facility]   INCISE AND DRAIN ABCESS     Right Groin- Positive MRSA per patient   PILONIDAL CYST DRAINAGE  01/2014   Dr. Juliann Pulse   PILONIDAL CYST DRAINAGE N/A 03/30/2016   Procedure: IRRIGATION AND DEBRIDEMENT PILONIDAL CYST. Prone position;  Surgeon: Ricarda Frame, MD;  Location: [facility] ORS;  Service: General;  Laterality: N/A;   PILONIDAL CYST DRAINAGE N/A 01/07/2021   Procedure: IRRIGATION AND DEBRIDEMENT PILONIDAL CYST;  Surgeon: Henrene Dodge, MD;  Location: [facility] ORS;  Service: General;  Laterality: N/A;  Prone position    Prior to Admission medications   Medication Sig Start Date End Date Taking? Authorizing Provider  acetaminophen (TYLENOL) 500 MG tablet Take 1,000 mg by mouth every 6 (six) hours as needed (pain).    [provider]  albuterol (PROVENTIL HFA;VENTOLIN HFA) 108 (90 Base) MCG/ACT inhaler Inhale 2 puffs into the lungs every 4 (four) hours as needed for wheezing or shortness of breath. 11/22/16   Cuthriell, Delorise Royals, PA-C     Allergies Ibuprofen  Family History  Problem Relation Age of Onset   COPD Maternal Grandmother    Fibromyalgia Maternal Grandmother    Coronary artery disease Maternal Grandmother    Cancer Mother         cervical   Psoriasis Mother    Skin cancer Mother     Social History Social History   Tobacco Use   Smoking status: Former    Packs/day: 0.25    Types: Cigarettes   Smokeless tobacco: Never  Vaping Use   Vaping Use: Every day   Substances: Nicotine  Substance Use Topics   Alcohol use: Not Currently    Comment: Social   Drug use: No    Review of Systems  Constitutional: Chills as above  ENT: As above   Gastrointestinal: No abdominal pain.  No nausea, no vomiting.   Genitourinary: Negative for dysuria. Musculoskeletal: Negative for back pain. Skin: Negative for rash. Neurological: Negative for headaches     ____________________________________________   PHYSICAL EXAM:  VITAL SIGNS: ED Triage Vitals  Enc Vitals Group     BP 02/19/21 1033 122/74     Pulse Rate 02/19/21 1033 96     Resp 02/19/21 1033 16     Temp 02/19/21 1033 98.5 F (36.9 C)     Temp Source 02/19/21 1033 Oral     SpO2 02/19/21 1033 97 %     Weight 02/19/21 1026 78.2 kg (172 lb 6.4 oz)     Height 02/19/21 1026 1.613 m (5' 3.5")     Head Circumference --      Peak Flow --      Pain Score 02/19/21 1026 7     Pain Loc --  Pain Edu? --      Excl. in GC? --      Constitutional: Alert and oriented. No acute distress. Pleasant and interactive Eyes: Conjunctivae are normal.  Head: Atraumatic. Nose: No congestion/rhinnorhea. Mouth/Throat: Mucous membranes are moist.  Pharynx exam is normal Cardiovascular: Normal rate, regular rhythm.  Respiratory: Normal respiratory effort.  No retractions. Genitourinary: deferred Musculoskeletal: No lower extremity tenderness nor edema.   Neurologic:  Normal speech and language. No gross focal neurologic deficits are appreciated.   Skin:  Skin is warm, dry and intact. No rash noted.   ____________________________________________   LABS (all labs ordered are listed, but only abnormal results are displayed)  Labs Reviewed  URINALYSIS, COMPLETE  (UACMP) WITH MICROSCOPIC - Abnormal; Notable for the following components:      Result Value   Hgb urine dipstick SMALL (*)    Leukocytes,Ua TRACE (*)    All other components within normal limits  RESP PANEL BY RT-PCR (FLU A&B, COVID) ARPGX2  POC URINE PREG, ED   ____________________________________________  EKG   ____________________________________________  RADIOLOGY   ____________________________________________   PROCEDURES  Procedure(s) performed: No  Procedures   Critical Care performed: No ____________________________________________   INITIAL IMPRESSION / ASSESSMENT AND PLAN / ED COURSE  Pertinent labs & imaging results that were available during my care of the patient were reviewed by me and considered in my medical decision making (see chart for details).   Patient well-appearing and in no acute distress.  Symptoms are consistent with upper respiratory illness, possibly influenza versus COVID as these are prevalent in the community this time  Regardless overall she is quite well-appearing with normal exam, normal vitals.  Recommend supportive care, outpatient follow-up as needed   ____________________________________________   FINAL CLINICAL IMPRESSION(S) / ED DIAGNOSES  Final diagnoses:  Viral URI with cough      NEW MEDICATIONS STARTED DURING THIS VISIT:  Discharge Medication List as of 02/19/2021 11:58 AM       Note:  This document was prepared using Dragon voice recognition software and may include unintentional dictation errors.    Jene Every, MD 02/19/21 1212

## 2021-04-14 ENCOUNTER — Other Ambulatory Visit: Payer: Self-pay

## 2021-04-14 ENCOUNTER — Ambulatory Visit (LOCAL_COMMUNITY_HEALTH_CENTER): Payer: Medicaid Other

## 2021-04-14 VITALS — BP 123/81 | Ht 63.5 in | Wt 170.5 lb

## 2021-04-14 DIAGNOSIS — Z3009 Encounter for other general counseling and advice on contraception: Secondary | ICD-10-CM | POA: Diagnosis not present

## 2021-04-14 DIAGNOSIS — Z3042 Encounter for surveillance of injectable contraceptive: Secondary | ICD-10-CM

## 2021-04-14 DIAGNOSIS — Z30013 Encounter for initial prescription of injectable contraceptive: Secondary | ICD-10-CM | POA: Diagnosis not present

## 2021-04-14 NOTE — Progress Notes (Addendum)
11 weeks 0 days post depo. Pt asks about any meds that would decrease effectiveness of depo. Pt explains she will be having surgery for pilonidal cyst this year. Consult E Sciora, CNM who states she is unaware of any meds that would decrease effectiveness of depo and advises pt to adhere to depo schedule of every 11 - 13 week intervals between injections. RN explained this to pt and she reports understanding. RN advised pt to notify her provider at her surgery of all meds she is taking.   Depo given today per order by Beatris Si, PA dated 01/27/21. Tolerated well R delt. (Pt preferred R arm today). Depo consent signed today. Next depo due 06/30/2021, has reminder. Jerel Shepherd, RN Consulted on the plan of care for this client.  I agree with the documented note and actions taken to provide care for this client.  Hazle Coca, CNM

## 2021-04-15 ENCOUNTER — Other Ambulatory Visit: Payer: Self-pay

## 2021-04-15 ENCOUNTER — Emergency Department
Admission: EM | Admit: 2021-04-15 | Discharge: 2021-04-15 | Disposition: A | Discharge status: Home / Self Care | Payer: Medicaid Other | Attending: Emergency Medicine | Admitting: Emergency Medicine

## 2021-04-15 ENCOUNTER — Encounter: Payer: Self-pay | Admitting: Emergency Medicine

## 2021-04-15 DIAGNOSIS — U071 COVID-19: Secondary | ICD-10-CM

## 2021-04-15 DIAGNOSIS — R509 Fever, unspecified: Secondary | ICD-10-CM | POA: Diagnosis present

## 2021-04-15 DIAGNOSIS — J45909 Unspecified asthma, uncomplicated: Secondary | ICD-10-CM | POA: Insufficient documentation

## 2021-04-15 LAB — RESP PANEL BY RT-PCR (FLU A&B, COVID) ARPGX2
Influenza A by PCR: NEGATIVE
Influenza B by PCR: NEGATIVE
SARS Coronavirus 2 by RT PCR: POSITIVE — AB

## 2021-04-15 NOTE — ED Notes (Signed)
Pt to ED for sore throat, fever of 103 at home, congestion, runny nose, cough since this morning.  Provider at bedside.

## 2021-04-15 NOTE — ED Notes (Signed)
Pt encounter complete but she departed before receiving her discharge paperwork.

## 2021-04-15 NOTE — ED Triage Notes (Signed)
Pt comes into the ED via POV c/o cough, fever, chills, headache.  Pt in NAD at this time with even and unlabored respirations.  Pt states the symptoms started this morning.

## 2021-04-15 NOTE — ED Provider Notes (Signed)
[facility] Provider Note    Event Date/Time   First MD Initiated Contact with Patient 04/15/21 1542     (approximate)   History   Chief Complaint Fever, Cough, Chills, and Headache   HPI Paige Mitchell is a 26 y.o. female, history of asthma, presents to the emergency department for evaluation of flulike symptoms.  Patient endorses cough, fever, chills, and headache.  Patient states that the symptoms started this morning. Denies chest pain, shortness of breath, abdominal pain, nausea/vomiting, or urinary symptoms.  History Limitations: No limitations.      Physical Exam  Triage Vital Signs: ED Triage Vitals  Enc Vitals Group     BP 04/15/21 1552 119/72     Pulse Rate 04/15/21 1552 (!) 108     Resp 04/15/21 1552 16     Temp 04/15/21 1552 100.2 F (37.9 C)     Temp Source 04/15/21 1552 Oral     SpO2 04/15/21 1552 99 %     Weight 04/15/21 1528 170 lb 8.7 oz (77.4 kg)     Height 04/15/21 1528 5' 3.5" (1.613 m)     Head Circumference --      Peak Flow --      Pain Score 04/15/21 1528 4     Pain Loc --      Pain Edu? --      Excl. in GC? --     Most recent vital signs: Vitals:   04/15/21 1552  BP: 119/72  Pulse: (!) 108  Resp: 16  Temp: 100.2 F (37.9 C)  SpO2: 99%    General: Awake, NAD.  CV: Good peripheral perfusion.  Resp: Normal effort.  Lung sounds clear bilaterally in the apices and bases. Abd: Soft, non-tender. No distention.  Neuro: At baseline. No gross neurological deficits. Other: Throat erythematous, though no tonsillar swelling or exudates.  Uvula midline.  Physical Exam    ED Results / Procedures / Treatments  Labs (all labs ordered are listed, but only abnormal results are displayed) Labs Reviewed  RESP PANEL BY RT-PCR (FLU A&B, COVID) ARPGX2 - Abnormal; Notable for the following components:      Result Value   SARS Coronavirus 2 by RT PCR POSITIVE (*)    All other components within normal limits      EKG Not applicable   RADIOLOGY  ED Provider Interpretation: Not applicable  No results found.  PROCEDURES:  Critical Care performed: None.  Procedures    MEDICATIONS ORDERED IN ED: Medications - No data to display   IMPRESSION / MDM / ASSESSMENT AND PLAN / ED COURSE  I reviewed the triage vital signs and the nursing notes.                              Paige Mitchell is a 26 y.o. female, history of asthma, presents to the emergency department for evaluation of flulike symptoms.  Patient endorses cough, fever, chills, and headache.  Patient states that the symptoms started this morning.   Differential diagnosis includes, but is not limited to, influenza, COVID-19, bronchitis, strep pharyngitis, pneumonia.    ED Course Patient appears well.  Vital signs within normal limits, though she is mildly tachycardic at 108.  Respiratory panel positive for COVID-19.  Assessment/Plan Signs and symptoms consistent with COVID-19 infection.  Confirmed by PCR.  I do not suspect any underlying pneumonia given her presentation.  Patient does not have any  significant risk factors warranting the need for antiviral therapy. Patient stated that she needed to leave and spoke to one of the other providers prior to walking out.  She was notified of her positive COVID result.  Patient did not stay for discharge instructions.       FINAL CLINICAL IMPRESSION(S) / ED DIAGNOSES   Final diagnoses:  COVID     Rx / DC Orders   ED Discharge Orders     None        Note:  This document was prepared using Dragon voice recognition software and may include unintentional dictation errors.   Varney Daily, [place] 04/15/21 Barbette Reichmann    Sharman Cheek, MD 04/15/21 [phone]

## 2021-04-15 NOTE — Discharge Instructions (Addendum)
-  Take Tylenol/ibuprofen as needed for pain/fever. -Return to the emergency department anytime if you begin to experience any new or worsening symptoms. -Avoid contact with others for least 5 days to avoid spreading the virus

## 2021-06-16 ENCOUNTER — Ambulatory Visit (LOCAL_COMMUNITY_HEALTH_CENTER): Payer: Medicaid Other

## 2021-06-16 VITALS — BP 124/84 | Ht 63.5 in | Wt 164.0 lb

## 2021-06-16 DIAGNOSIS — Z3009 Encounter for other general counseling and advice on contraception: Secondary | ICD-10-CM | POA: Diagnosis not present

## 2021-06-16 NOTE — Progress Notes (Signed)
In Nurse Clinic. 9 weeks 0 days post depo. Last depo administered 04/14/2021. Pt is too early to receive depo today. RN explained this to pt and counseled pt that 11 -13 week intervals between depo is optimal. Pt says she thought her depo was due today. Pt asks RN to schedule depo appt. Per pt preference, Depo scheduled 06/30/2021 at 8:40 am. Appt card given. Questions answered and reports understanding. Jerel Shepherd, RN ? ?

## 2021-06-30 ENCOUNTER — Ambulatory Visit (LOCAL_COMMUNITY_HEALTH_CENTER): Payer: Medicaid Other

## 2021-06-30 VITALS — BP 116/79 | Ht 63.5 in | Wt 160.0 lb

## 2021-06-30 DIAGNOSIS — Z30013 Encounter for initial prescription of injectable contraceptive: Secondary | ICD-10-CM

## 2021-06-30 DIAGNOSIS — Z3042 Encounter for surveillance of injectable contraceptive: Secondary | ICD-10-CM

## 2021-06-30 DIAGNOSIS — Z3009 Encounter for other general counseling and advice on contraception: Secondary | ICD-10-CM

## 2021-06-30 DIAGNOSIS — Z309 Encounter for contraceptive management, unspecified: Secondary | ICD-10-CM | POA: Diagnosis not present

## 2021-06-30 NOTE — Progress Notes (Signed)
11 weeks 0 days post depo. Explains she has noticed some wt loss over past 6 mo. States she is more active and eats mainly fish and salad, but eats sporadically during day and often skips meals. RN counseled on ways to increase protein, fat in diet (whole grains, healthy carbs, instant carnation breakfast/Boost/Ensure). Encouraged to keep food diary and eat regularly. Pt does not have PCP. Advised to establish PCP. Local PCP resource list given.  ? ?Depo given today per order by Beatris Si, PA dated 01/27/2021. Tolerated well R delt (arm preference per pt). Next depo due 09/15/2021, reminder given. Jerel Shepherd, RN ? ?

## 2021-07-23 ENCOUNTER — Encounter: Payer: Self-pay | Admitting: Surgery

## 2021-07-23 ENCOUNTER — Ambulatory Visit (INDEPENDENT_AMBULATORY_CARE_PROVIDER_SITE_OTHER): Payer: Medicaid Other | Admitting: Surgery

## 2021-07-23 VITALS — BP 124/79 | HR 81 | Temp 98.4°F | Wt 158.8 lb

## 2021-07-23 DIAGNOSIS — L0501 Pilonidal cyst with abscess: Secondary | ICD-10-CM

## 2021-07-23 MED ORDER — AMOXICILLIN-POT CLAVULANATE 875-125 MG PO TABS: 1.0000 | ORAL_TABLET | Freq: Two times a day (BID) | ORAL | 0 refills | Status: AC

## 2021-07-23 MED ORDER — OXYCODONE HCL 5 MG PO TABS: 5.0000 mg | ORAL_TABLET | Freq: Four times a day (QID) | ORAL | 0 refills | Status: DC | PRN

## 2021-07-23 NOTE — Progress Notes (Signed)
?07/23/2021 ? ?History of Present Illness: ?Paige Mitchell is a 26 y.o. female s/p I&D of pilonidal abscess on 01/07/21 in the operating room.  She presents today because she feels the area if flaring up again.  After I&D, the wound fully healed within the month.  She did not follow up regarding trying to schedule for formal excision of the cyst.  She reports today that she had been doing well without issues until last week that her boyfriend noted that the area was swelling up again.  Initially she did not have pain but this week started having more swelling and pain.  Denies any drainage or redness of the skin.  Denies any fevers, chills. ? ?Past Medical History: ?Past Medical History:  ?Diagnosis Date  ? Asthma   ?  ? ?Past Surgical History: ?Past Surgical History:  ?Procedure Laterality Date  ? COLONOSCOPY WITH ESOPHAGOGASTRODUODENOSCOPY (EGD)  2014  ? [facility]  ? INCISE AND DRAIN ABCESS    ? Right Groin- Positive MRSA per patient  ? PILONIDAL CYST DRAINAGE  01/2014  ? Dr. Rexene Edison  ? PILONIDAL CYST DRAINAGE N/A 03/30/2016  ? Procedure: IRRIGATION AND DEBRIDEMENT PILONIDAL CYST. Prone position;  Surgeon: Clayburn Pert, MD;  Location: [facility] ORS;  Service: General;  Laterality: N/A;  ? PILONIDAL CYST DRAINAGE N/A 01/07/2021  ? Procedure: IRRIGATION AND DEBRIDEMENT PILONIDAL CYST;  Surgeon: Olean Ree, MD;  Location: [facility] ORS;  Service: General;  Laterality: N/A;  Prone position  ? ? ?Home Medications: ?Prior to Admission medications   ?Medication Sig Start Date End Date Taking? Authorizing Provider  ?acetaminophen (TYLENOL) 500 MG tablet Take 1,000 mg by mouth every 6 (six) hours as needed (pain).   Yes [provider]  ?albuterol (PROVENTIL HFA;VENTOLIN HFA) 108 (90 Base) MCG/ACT inhaler Inhale 2 puffs into the lungs every 4 (four) hours as needed for wheezing or shortness of breath. 11/22/16  Yes Cuthriell, Charline Bills, PA-C  ?amoxicillin-clavulanate (AUGMENTIN) 875-125 MG tablet Take 1 tablet by mouth 2  (two) times daily for 10 days. 07/23/21 08/02/21 Yes Delita Chiquito, Jacqulyn Bath, MD  ?oxyCODONE (ROXICODONE) 5 MG immediate release tablet Take 1 tablet (5 mg total) by mouth every 6 (six) hours as needed for severe pain. 07/23/21 07/23/22 Yes Olean Ree, MD  ? ? ?Allergies: ?Allergies  ?Allergen Reactions  ? Ibuprofen Hives and Swelling  ? ? ?Review of Systems: ?Review of Systems  ?Constitutional:  Negative for chills and fever.  ?Respiratory:  Negative for shortness of breath.   ?Cardiovascular:  Negative for chest pain.  ?Gastrointestinal:  Negative for abdominal pain, nausea and vomiting.  ?Skin:   ?     Swelling/tenderness in gluteal cleft  ? ?Physical Exam ?BP 124/79   Pulse 81   Temp 98.4 ?F (36.9 ?C) (Oral)   Wt 158 lb 12.8 oz (72 kg)   SpO2 99%   BMI 27.69 kg/m?  ?CONSTITUTIONAL: No acute distress, well nourished. ?HEENT:  Normocephalic, atraumatic, extraocular motion intact. ?RESPIRATORY:  Normal respiratory effort without pathologic use of accessory muscles. ?CARDIOVASCULAR: Regular rhythm and rate. ?SKIN:  The right upper gluteal cleft has well healed I&D scar.  However, in the same location, she has some developing fluctuance with some tenderness.  No induration or erythema, ulceration or drainage.  Patient has stable pilonidal pits/sinuses which were present prior to her last I&D as well. ?NEUROLOGIC:  Motor and sensation is grossly normal.  Cranial nerves are grossly intact. ?PSYCH:  Alert and oriented to person, place and time. Affect is normal. ? ? ? ?  Assessment and Plan: ?This is a 26 y.o. female s/p I&D of pilonidal abscess, now with an early recurrence. ? ?--Discussed with the patient that this episode is not as severe as the previous one that requried I&D in the OR.  Currently there's some fluctuance but no induration or erythema. ?--Discussed with the patient that we can attempt po antibiotic treatment to see if this can cool down so that later on we can schedule formal excision.  She's interested in  excision in the future.   ?--Will give her 10 day course of Augmentin and have her follow up in about two weeks to re-evaluate and possibly schedule excision.  Return precautions given, particularly if the tenderness/swelling worsen so we can repeat I&D again. ? ?I spent 25 minutes dedicated to the care of this patient on the date of this encounter to include pre-visit review of records, face-to-face time with the patient discussing diagnosis and management, and any post-visit coordination of care. ? ? ?Melvyn Neth, MD ?[facility] ? ? ?  ?

## 2021-07-23 NOTE — Patient Instructions (Addendum)
Please pick up your prescriptions at your pharmacy. ? ? ?If you have any concerns or questions, please feel free to call our office. See follow up appointment below.  ? ?Pilonidal Cyst ? ?A pilonidal cyst is a fluid-filled sac that forms beneath the skin near the tailbone, at the top of the crease of the buttocks (pilonidal area). If the cyst is not large and not infected, it may not cause any problems. ?If the cyst becomes irritated or infected, it may get larger and fill with pus. An infected cyst is called an abscess. A pilonidal abscess may cause pain and swelling, and it may need to be drained or removed. ?What are the causes? ?The cause of this condition is not always known. In some cases, a hair that grows into your skin (ingrown hair) may be the cause. ?What increases the risk? ?You are more likely to get a pilonidal cyst if you: ?Are female. ?Have lots of hair near the crease of the buttocks. ?Are overweight. ?Have a dimple near the crease of the buttocks. ?Wear tight clothing. ?Do not bathe or shower often. ?Sit for long periods of time. ?What are the signs or symptoms? ?Signs and symptoms of a pilonidal cyst may include pain, swelling, redness, and warmth in the pilonidal area. Depending on how big the cyst is, you may be able to feel a lump near your tailbone. If your cyst becomes infected, symptoms may include: ?Pus or fluid drainage. ?Fever. ?Pain, swelling, and redness getting worse. ?The lump getting bigger. ?How is this diagnosed? ?This condition may be diagnosed based on: ?Your symptoms and medical history. ?A physical exam. ?A blood test to check for infection. ?Testing a pus sample, if applicable. ?How is this treated? ?If your cyst does not cause symptoms, you may not need any treatment. If your cyst bothers you or is infected, you may need a procedure to drain or remove the cyst. Depending on the size, location, and severity of your cyst, your health care provider may: ?Make an incision in the  cyst and drain it (incision and drainage). ?Open and drain the cyst, and then stitch the wound so that it stays open while it heals (marsupialization). You will be given instructions about how to care for your open wound while it heals. ?Remove all or part of the cyst, and then close the wound (cyst removal). ?You may need to take antibiotic medicines before your procedure. ?Follow these instructions at home: ?Medicines ?Take over-the-counter and prescription medicines only as told by your health care provider. ?If you were prescribed an antibiotic medicine, take it as told by your health care provider. Do not stop taking the antibiotic even if you start to feel better. ?General instructions ?Keep the area around your pilonidal cyst clean and dry. ?If there is fluid or pus draining from your cyst: ?Cover the area with a clean bandage (dressing) as needed. ?Wash the area gently with soap and water. Pat the area dry with a clean towel. Do not rub the area because that may cause bleeding. ?Remove hair from the area around the cyst only if your health care provider tells you to do this. ?Do not wear tight pants or sit in one position for long periods at a time. ?Keep all follow-up visits as told by your health care provider. This is important. ?Contact a health care provider if you have: ?New redness, swelling, or pain. ?A fever. ?Severe pain. ?Summary ?A pilonidal cyst is a fluid-filled sac that forms beneath the  skin near the tailbone, at the top of the crease of the buttocks (pilonidal area). ?If the cyst becomes irritated or infected, it may get larger and fill with pus. An infected cyst is called an abscess. ?The cause of this condition is not always known. In some cases, a hair that grows into your skin (ingrown hair) may be the cause. ?If your cyst does not cause symptoms, you may not need any treatment. If your cyst bothers you or is infected, you may need a procedure to drain or remove the cyst. ?This  information is not intended to replace advice given to you by your health care provider. Make sure you discuss any questions you have with your health care provider. ?Document Revised: 01/04/2020 Document Reviewed: 01/04/2020 ?Elsevier Patient Education ? 2023 Elsevier Inc[employer] ? ?

## 2021-08-08 ENCOUNTER — Other Ambulatory Visit: Payer: Self-pay

## 2021-08-08 ENCOUNTER — Encounter: Payer: Self-pay | Admitting: Surgery

## 2021-08-08 ENCOUNTER — Ambulatory Visit (INDEPENDENT_AMBULATORY_CARE_PROVIDER_SITE_OTHER): Payer: Medicaid Other | Admitting: Surgery

## 2021-08-08 ENCOUNTER — Telehealth: Payer: Self-pay | Admitting: Surgery

## 2021-08-08 VITALS — BP 103/71 | HR 90 | Temp 98.9°F | Ht 63.0 in | Wt 159.0 lb

## 2021-08-08 DIAGNOSIS — L0501 Pilonidal cyst with abscess: Secondary | ICD-10-CM

## 2021-08-08 MED ORDER — AMOXICILLIN-POT CLAVULANATE 875-125 MG PO TABS: 1.0000 | ORAL_TABLET | Freq: Two times a day (BID) | ORAL | 0 refills | Status: DC

## 2021-08-08 NOTE — Patient Instructions (Addendum)
Our surgery scheduler weill call you within 24-48 hours to schedule your surgery. Please have the Blue surgery sheet available when speaking with her.   Pilonidal Cyst  A pilonidal cyst is a fluid-filled sac that forms beneath the skin near the tailbone, at the top of the crease of the buttocks (pilonidal area). If the cyst is not large and not infected, it may not cause any problems. If the cyst becomes irritated or infected, it may get larger and fill with pus. An infected cyst is called an abscess. A pilonidal abscess may cause pain and swelling, and it may need to be drained or removed. What are the causes? The cause of this condition is not always known. In some cases, a hair that grows into your skin (ingrown hair) may be the cause. What increases the risk? You are more likely to get a pilonidal cyst if you: Are female. Have lots of hair near the crease of the buttocks. Are overweight. Have a dimple near the crease of the buttocks. Wear tight clothing. Do not bathe or shower often. Sit for long periods of time. What are the signs or symptoms? Signs and symptoms of a pilonidal cyst may include pain, swelling, redness, and warmth in the pilonidal area. Depending on how big the cyst is, you may be able to feel a lump near your tailbone. If your cyst becomes infected, symptoms may include: Pus or fluid drainage. Fever. Pain, swelling, and redness getting worse. The lump getting bigger. How is this diagnosed? This condition may be diagnosed based on: Your symptoms and medical history. A physical exam. A blood test to check for infection. Testing a pus sample, if applicable. How is this treated? If your cyst does not cause symptoms, you may not need any treatment. If your cyst bothers you or is infected, you may need a procedure to drain or remove the cyst. Depending on the size, location, and severity of your cyst, your health care provider may: Make an incision in the cyst and drain it  (incision and drainage). Open and drain the cyst, and then stitch the wound so that it stays open while it heals (marsupialization). You will be given instructions about how to care for your open wound while it heals. Remove all or part of the cyst, and then close the wound (cyst removal). You may need to take antibiotic medicines before your procedure. Follow these instructions at home: Medicines Take over-the-counter and prescription medicines only as told by your health care provider. If you were prescribed an antibiotic medicine, take it as told by your health care provider. Do not stop taking the antibiotic even if you start to feel better. General instructions Keep the area around your pilonidal cyst clean and dry. If there is fluid or pus draining from your cyst: Cover the area with a clean bandage (dressing) as needed. Wash the area gently with soap and water. Pat the area dry with a clean towel. Do not rub the area because that may cause bleeding. Remove hair from the area around the cyst only if your health care provider tells you to do this. Do not wear tight pants or sit in one position for long periods at a time. Keep all follow-up visits as told by your health care provider. This is important. Contact a health care provider if you have: New redness, swelling, or pain. A fever. Severe pain. Summary A pilonidal cyst is a fluid-filled sac that forms beneath the skin near the tailbone, at  the top of the crease of the buttocks (pilonidal area). If the cyst becomes irritated or infected, it may get larger and fill with pus. An infected cyst is called an abscess. The cause of this condition is not always known. In some cases, a hair that grows into your skin (ingrown hair) may be the cause. If your cyst does not cause symptoms, you may not need any treatment. If your cyst bothers you or is infected, you may need a procedure to drain or remove the cyst. This information is not intended  to replace advice given to you by your health care provider. Make sure you discuss any questions you have with your health care provider. Document Revised: 01/04/2020 Document Reviewed: 01/04/2020 Elsevier Patient Education  2023 [employer].

## 2021-08-08 NOTE — Progress Notes (Signed)
08/08/2021  History of Present Illness: Paige Mitchell is a 26 y.o. female presenting for follow-up of infected pilonidal cyst.  Patient had incision and drainage of a pilonidal cyst abscess last year on 01/07/2021.  She has had prior I&D procedures in the past.  She was last seen on 07/23/2021 with a new flareup and was treated with an antibiotic course of Augmentin.  Today, she reports that she is feeling better with still some discomfort in the gluteal cleft area but much improved compared to before.  Denies any drainage, fevers, chills.  She has still been taking some pain medication particularly after long days at work.  Past Medical History: Past Medical History:  Diagnosis Date   Asthma      Past Surgical History: Past Surgical History:  Procedure Laterality Date   COLONOSCOPY WITH ESOPHAGOGASTRODUODENOSCOPY (EGD)  2014   [facility]   INCISE AND DRAIN ABCESS     Right Groin- Positive MRSA per patient   PILONIDAL CYST DRAINAGE  01/2014   Dr. Juliann Pulse   PILONIDAL CYST DRAINAGE N/A 03/30/2016   Procedure: IRRIGATION AND DEBRIDEMENT PILONIDAL CYST. Prone position;  Surgeon: Ricarda Frame, MD;  Location: [facility] ORS;  Service: General;  Laterality: N/A;   PILONIDAL CYST DRAINAGE N/A 01/07/2021   Procedure: IRRIGATION AND DEBRIDEMENT PILONIDAL CYST;  Surgeon: Henrene Dodge, MD;  Location: [facility] ORS;  Service: General;  Laterality: N/A;  Prone position    Home Medications: Prior to Admission medications   Medication Sig Start Date End Date Taking? Authorizing Provider  acetaminophen (TYLENOL) 500 MG tablet Take 1,000 mg by mouth every 6 (six) hours as needed (pain).   Yes [provider]  albuterol (PROVENTIL HFA;VENTOLIN HFA) 108 (90 Base) MCG/ACT inhaler Inhale 2 puffs into the lungs every 4 (four) hours as needed for wheezing or shortness of breath. 11/22/16  Yes Cuthriell, Delorise Royals, PA-C  amoxicillin-clavulanate (AUGMENTIN) 875-125 MG tablet Take 1 tablet by mouth 2 (two) times  daily. 08/08/21  Yes Henrene Dodge, MD    Allergies: Allergies  Allergen Reactions   Ibuprofen Hives and Swelling    Review of Systems: Review of Systems  Constitutional:  Negative for chills and fever.  HENT:  Negative for hearing loss.   Respiratory:  Negative for shortness of breath.   Cardiovascular:  Negative for chest pain.  Gastrointestinal:  Negative for abdominal pain, nausea and vomiting.  Genitourinary:  Negative for dysuria.  Musculoskeletal:  Negative for myalgias.  Skin:  Negative for rash.  Neurological:  Negative for dizziness.  Psychiatric/Behavioral:  Negative for depression.    Physical Exam BP 103/71   Pulse 90   Temp 98.9 F (37.2 C) (Oral)   Ht 5\' 3"  (1.6 m)   Wt 159 lb (72.1 kg)   SpO2 98%   BMI 28.17 kg/m  CONSTITUTIONAL: No acute distress, well-nourished HEENT:  Normocephalic, atraumatic, extraocular motion intact. NECK: Trachea is midline, no jugular venous distention. RESPIRATORY:  Lungs are clear, and breath sounds are equal bilaterally. Normal respiratory effort without pathologic use of accessory muscles. CARDIOVASCULAR: Heart is regular without murmurs, gallops, or rubs. SKIN: The superior gluteal cleft shows 3 small pilonidal sinuses about 1.5 cm medial to the prior I&D site in the medial right buttocks.  Palpating the prior I&D site has some tenderness but the area is soft, with no induration or erythema.  No active drainage. MUSCULOSKELETAL: Normal gait, no peripheral edema. NEUROLOGIC:  Motor and sensation is grossly normal.  Cranial nerves are grossly intact. PSYCH:  Alert  and oriented to person, place and time. Affect is normal.   Assessment and Plan: This is a 26 y.o. female with a pilonidal cyst infection flareup.  - The patient reports that she has been doing better with antibiotic regimen that she completed.  However there are still some persistent discomfort.  As a precaution, we will give her a new course of Augmentin to continue  treating this better in preparation for surgery.   - Discussed with her the plan for surgical excision of the pilonidal cyst to prevent further recurrences in the future.  She is in agreement with this.  Reviewed with her the surgery at length including the incision, the potential for closure versus leaving it open depending on if there is any residual infection, postoperative pain, activity restrictions, that this is an outpatient surgery, and she is willing to proceed. - We will schedule her for pilonidal cyst excision on 08/28/2021.  I spent 40 minutes dedicated to the care of this patient on the date of this encounter to include pre-visit review of records, face-to-face time with the patient discussing diagnosis and management, and any post-visit coordination of care.   Howie Ill, MD [facility]

## 2021-08-08 NOTE — Telephone Encounter (Signed)
Patient has been advised of Pre-Admission date/time, COVID Testing date and Surgery date. ? ?Surgery Date: 08/28/21 ?Preadmission Testing Date: 08/20/21 (phone 8a-1p) ?Covid Testing Date: Not needed.   ? ?Patient has been made aware to call [phone], between 1-3:00pm the day before surgery, to find out what time to arrive for surgery.   ? ?

## 2021-08-20 ENCOUNTER — Encounter
Admission: RE | Admit: 2021-08-20 | Discharge: 2021-08-20 | Disposition: A | Discharge status: Home / Self Care | Payer: Medicaid Other | Source: Ambulatory Visit | Attending: Surgery | Admitting: Surgery

## 2021-08-20 VITALS — Ht 63.0 in | Wt 158.0 lb

## 2021-08-20 DIAGNOSIS — L0501 Pilonidal cyst with abscess: Secondary | ICD-10-CM

## 2021-08-20 DIAGNOSIS — Z01812 Encounter for preprocedural laboratory examination: Secondary | ICD-10-CM

## 2021-08-20 NOTE — Patient Instructions (Addendum)
Your procedure is scheduled on: Thursday August 28, 2021. Report to Day Surgery inside [facility] 2nd floor, stop by admissions desk before getting on elevator.  To find out your arrival time please call [phone] between 1PM - 3PM on Wednesday August 27, 2021.  Remember: Instructions that are not followed completely may result in serious medical risk,  up to and including death, or upon the discretion of your surgeon and anesthesiologist your  surgery may need to be rescheduled.     _X__ 1. Do not eat food after midnight the night before your procedure.                 No chewing gum or hard candies. You may drink clear liquids up to 2 hours                 before you are scheduled to arrive for your surgery- DO not drink clear                 liquids within 2 hours of the start of your surgery.                 Clear Liquids include:  water, apple juice without pulp, clear Gatorade, G2 or                  Gatorade Zero (avoid Red/Purple/Blue), Black Coffee or Tea (Do not add                 anything to coffee or tea).  __X__2.  On the morning of surgery brush your teeth with toothpaste and water, you                may rinse your mouth with mouthwash if you wish.  Do not swallow any toothpaste or mouthwash.     _X__ 3.  No Alcohol for 24 hours before or after surgery.   _X__ 4.  Do Not Smoke or use e-cigarettes For 24 Hours Prior to Your Surgery.                 Do not use any chewable tobacco products for at least 6 hours prior to                 Surgery.  _X__  5.  Do not use any recreational drugs (marijuana, cocaine, heroin, ecstasy, MDMA or other)                For at least one week prior to your surgery.  Combination of these drugs with anesthesia                May have life threatening results.  ____  6.  Bring all medications with you on the day of surgery if instructed.   __X__  7.  Notify your doctor if there is any change in your medical  condition      (cold, fever, infections).     Do not wear jewelry, make-up, hairpins, clips or nail polish. Do not wear lotions, powders, or perfumes. You may wear deodorant. Do not shave 48 hours prior to surgery. Do not bring valuables to the hospital.    [facility] is not responsible for any belongings or valuables.  Contacts, dentures or bridgework may not be worn into surgery. Leave your suitcase in the car. After surgery it may be brought to your room. For patients admitted to the hospital, discharge time is determined by your treatment team.  Patients discharged the day of surgery will not be allowed to drive home.   Make arrangements for someone to be with you for the first 24 hours of your Same Day Discharge.   __X__ Take these medicines the morning of surgery with A SIP OF WATER:   1. amoxicillin-clavulanate (AUGMENTIN) 875-125 MG (if still taking)  2.   3.   4.  5.  6.  ____ Fleet Enema (as directed)   __X__ Use CHG Soap (or Dial antibacterial soap) as directed  ____ Use Benzoyl Peroxide Gel as instructed  __X__ Use inhalers on the day of surgery  albuterol (PROVENTIL HFA;VENTOLIN HFA) 108 (90 Base) MCG/ACT inhaler  ____ Stop metformin 2 days prior to surgery    ____ Take 1/2 of usual insulin dose the night before surgery. No insulin the morning          of surgery.   ____ Call your PCP, cardiologist, or Pulmonologist if taking Coumadin/Plavix/aspirin and ask when to stop before your surgery.   __X__ One Week prior to surgery- Stop Anti-inflammatories such as Ibuprofen, Aleve, Advil, Motrin, meloxicam (MOBIC), diclofenac, etodolac, ketorolac, Toradol, Daypro, piroxicam, Goody's or BC powders. OK TO USE TYLENOL IF NEEDED   __X__ Do not start any vitamins and or supplements until after surgery.    ____ Bring C-Pap to the hospital.    If you have any questions regarding your pre-procedure instructions,  Please call Pre-admit Testing at [phone]

## 2021-08-27 MED ORDER — ORAL CARE MOUTH RINSE: 15.0000 mL | Freq: Once | OROMUCOSAL | Status: AC

## 2021-08-27 MED ORDER — GABAPENTIN 300 MG PO CAPS: 300.0000 mg | ORAL_CAPSULE | ORAL | Status: AC

## 2021-08-27 MED ORDER — CHLORHEXIDINE GLUCONATE CLOTH 2 % EX PADS: 6.0000 | MEDICATED_PAD | Freq: Once | CUTANEOUS | Status: DC

## 2021-08-27 MED ORDER — FAMOTIDINE 20 MG PO TABS: 20.0000 mg | ORAL_TABLET | Freq: Once | ORAL | Status: AC

## 2021-08-27 MED ORDER — CEFAZOLIN SODIUM-DEXTROSE 2-4 GM/100ML-% IV SOLN
2.0000 g | INTRAVENOUS | Status: AC
  Administered 2021-08-28: 2 g via INTRAVENOUS

## 2021-08-27 MED ORDER — BUPIVACAINE LIPOSOME 1.3 % IJ SUSP: 20.0000 mL | Freq: Once | INTRAMUSCULAR | Status: DC

## 2021-08-27 MED ORDER — ACETAMINOPHEN 500 MG PO TABS: 1000.0000 mg | ORAL_TABLET | ORAL | Status: AC

## 2021-08-27 MED ORDER — LACTATED RINGERS IV SOLN: INTRAVENOUS | Status: DC

## 2021-08-27 MED ORDER — CHLORHEXIDINE GLUCONATE 0.12 % MT SOLN: 15.0000 mL | Freq: Once | OROMUCOSAL | Status: AC

## 2021-08-28 ENCOUNTER — Other Ambulatory Visit: Payer: Self-pay

## 2021-08-28 ENCOUNTER — Encounter: Payer: Self-pay | Admitting: Surgery

## 2021-08-28 ENCOUNTER — Ambulatory Visit: Payer: Medicaid Other

## 2021-08-28 ENCOUNTER — Encounter
Admission: RE | Disposition: A | Discharge status: Home / Self Care | Payer: Self-pay | Source: Ambulatory Visit | Attending: Surgery

## 2021-08-28 ENCOUNTER — Ambulatory Visit
Admission: RE | Admit: 2021-08-28 | Discharge: 2021-08-28 | Disposition: A | Discharge status: Home / Self Care | Payer: Medicaid Other | Source: Ambulatory Visit | Attending: Surgery | Admitting: Surgery

## 2021-08-28 DIAGNOSIS — Z01812 Encounter for preprocedural laboratory examination: Secondary | ICD-10-CM

## 2021-08-28 DIAGNOSIS — L0591 Pilonidal cyst without abscess: Secondary | ICD-10-CM | POA: Diagnosis not present

## 2021-08-28 DIAGNOSIS — L0501 Pilonidal cyst with abscess: Secondary | ICD-10-CM

## 2021-08-28 DIAGNOSIS — J45909 Unspecified asthma, uncomplicated: Secondary | ICD-10-CM | POA: Diagnosis not present

## 2021-08-28 DIAGNOSIS — Z87891 Personal history of nicotine dependence: Secondary | ICD-10-CM | POA: Insufficient documentation

## 2021-08-28 HISTORY — PX: PILONIDAL CYST EXCISION: SHX744

## 2021-08-28 LAB — POCT PREGNANCY, URINE: Preg Test, Ur: NEGATIVE

## 2021-08-28 SURGERY — EXCISION, PILONIDAL CYST, EXTENSIVE
Anesthesia: General | Site: Buttocks

## 2021-08-28 MED ORDER — DEXAMETHASONE SODIUM PHOSPHATE 10 MG/ML IJ SOLN
INTRAMUSCULAR | Status: DC | PRN
  Administered 2021-08-28: 10 mg via INTRAVENOUS

## 2021-08-28 MED ORDER — FAMOTIDINE 20 MG PO TABS
ORAL_TABLET | ORAL | Status: AC
  Administered 2021-08-28: 20 mg via ORAL
  Filled 2021-08-28: qty 1

## 2021-08-28 MED ORDER — BUPIVACAINE-EPINEPHRINE (PF) 0.5% -1:200000 IJ SOLN
INTRAMUSCULAR | Status: AC
  Filled 2021-08-28: qty 30

## 2021-08-28 MED ORDER — KETAMINE HCL-SODIUM CHLORIDE 100-0.9 MG/10ML-% IV SOSY
PREFILLED_SYRINGE | INTRAVENOUS | Status: DC | PRN
  Administered 2021-08-28: 30 mg via INTRAVENOUS

## 2021-08-28 MED ORDER — PROPOFOL 10 MG/ML IV BOLUS
INTRAVENOUS | Status: AC
  Filled 2021-08-28: qty 20

## 2021-08-28 MED ORDER — ACETAMINOPHEN 500 MG PO TABS: 1000.0000 mg | ORAL_TABLET | Freq: Four times a day (QID) | ORAL | Status: AC | PRN

## 2021-08-28 MED ORDER — BUPIVACAINE-EPINEPHRINE (PF) 0.5% -1:200000 IJ SOLN
INTRAMUSCULAR | Status: DC | PRN
  Administered 2021-08-28: 50 mL

## 2021-08-28 MED ORDER — PROPOFOL 10 MG/ML IV BOLUS
INTRAVENOUS | Status: DC | PRN
  Administered 2021-08-28: 150 mg via INTRAVENOUS

## 2021-08-28 MED ORDER — 0.9 % SODIUM CHLORIDE (POUR BTL) OPTIME
TOPICAL | Status: DC | PRN
  Administered 2021-08-28: 500 mL

## 2021-08-28 MED ORDER — FENTANYL CITRATE (PF) 100 MCG/2ML IJ SOLN
INTRAMUSCULAR | Status: AC
  Filled 2021-08-28: qty 2

## 2021-08-28 MED ORDER — OXYCODONE HCL 5 MG PO TABS: 5.0000 mg | ORAL_TABLET | ORAL | 0 refills | Status: DC | PRN

## 2021-08-28 MED ORDER — ACETAMINOPHEN 500 MG PO TABS
ORAL_TABLET | ORAL | Status: AC
  Administered 2021-08-28: 1000 mg via ORAL
  Filled 2021-08-28: qty 2

## 2021-08-28 MED ORDER — BUPIVACAINE LIPOSOME 1.3 % IJ SUSP
INTRAMUSCULAR | Status: AC
  Filled 2021-08-28: qty 20

## 2021-08-28 MED ORDER — KETAMINE HCL 50 MG/5ML IJ SOSY
PREFILLED_SYRINGE | INTRAMUSCULAR | Status: AC
  Filled 2021-08-28: qty 5

## 2021-08-28 MED ORDER — ONDANSETRON HCL 4 MG/2ML IJ SOLN
INTRAMUSCULAR | Status: DC | PRN
  Administered 2021-08-28: 4 mg via INTRAVENOUS

## 2021-08-28 MED ORDER — ROCURONIUM BROMIDE 100 MG/10ML IV SOLN
INTRAVENOUS | Status: DC | PRN
  Administered 2021-08-28: 60 mg via INTRAVENOUS

## 2021-08-28 MED ORDER — CEFAZOLIN SODIUM-DEXTROSE 2-4 GM/100ML-% IV SOLN
INTRAVENOUS | Status: AC
  Filled 2021-08-28: qty 100

## 2021-08-28 MED ORDER — MIDAZOLAM HCL 2 MG/2ML IJ SOLN
INTRAMUSCULAR | Status: AC
  Filled 2021-08-28: qty 2

## 2021-08-28 MED ORDER — ONDANSETRON HCL 4 MG/2ML IJ SOLN
INTRAMUSCULAR | Status: AC
  Filled 2021-08-28: qty 2

## 2021-08-28 MED ORDER — GABAPENTIN 300 MG PO CAPS
ORAL_CAPSULE | ORAL | Status: AC
  Administered 2021-08-28: 300 mg via ORAL
  Filled 2021-08-28: qty 1

## 2021-08-28 MED ORDER — LIDOCAINE HCL (CARDIAC) PF 100 MG/5ML IV SOSY
PREFILLED_SYRINGE | INTRAVENOUS | Status: DC | PRN
  Administered 2021-08-28: 80 mg via INTRAVENOUS

## 2021-08-28 MED ORDER — SUGAMMADEX SODIUM 200 MG/2ML IV SOLN
INTRAVENOUS | Status: DC | PRN
  Administered 2021-08-28: 200 mg via INTRAVENOUS

## 2021-08-28 MED ORDER — FENTANYL CITRATE (PF) 100 MCG/2ML IJ SOLN: 25.0000 ug | INTRAMUSCULAR | Status: DC | PRN

## 2021-08-28 MED ORDER — FENTANYL CITRATE (PF) 100 MCG/2ML IJ SOLN
INTRAMUSCULAR | Status: DC | PRN
  Administered 2021-08-28: 50 ug via INTRAVENOUS

## 2021-08-28 MED ORDER — DEXAMETHASONE SODIUM PHOSPHATE 10 MG/ML IJ SOLN
INTRAMUSCULAR | Status: AC
  Filled 2021-08-28: qty 1

## 2021-08-28 MED ORDER — ROCURONIUM BROMIDE 10 MG/ML (PF) SYRINGE
PREFILLED_SYRINGE | INTRAVENOUS | Status: AC
  Filled 2021-08-28: qty 10

## 2021-08-28 MED ORDER — ONDANSETRON HCL 4 MG/2ML IJ SOLN: 4.0000 mg | Freq: Once | INTRAMUSCULAR | Status: DC | PRN

## 2021-08-28 MED ORDER — MIDAZOLAM HCL 2 MG/2ML IJ SOLN
INTRAMUSCULAR | Status: DC | PRN
  Administered 2021-08-28: 2 mg via INTRAVENOUS

## 2021-08-28 MED ORDER — CHLORHEXIDINE GLUCONATE 0.12 % MT SOLN
OROMUCOSAL | Status: AC
  Administered 2021-08-28: 15 mL via OROMUCOSAL
  Filled 2021-08-28: qty 15

## 2021-08-28 MED ORDER — LIDOCAINE HCL (PF) 2 % IJ SOLN
INTRAMUSCULAR | Status: AC
  Filled 2021-08-28: qty 5

## 2021-08-28 MED ORDER — PROPOFOL 1000 MG/100ML IV EMUL
INTRAVENOUS | Status: AC
  Filled 2021-08-28: qty 100

## 2021-08-28 SURGICAL SUPPLY — 40 items
APL PRP STRL LF DISP 70% ISPRP (MISCELLANEOUS) ×1
BLADE SURG 15 STRL LF DISP TIS (BLADE) ×1 IMPLANT
BLADE SURG 15 STRL SS (BLADE) ×2
CHLORAPREP W/TINT 26 (MISCELLANEOUS) ×1 IMPLANT
DRAPE LAPAROTOMY 100X77 ABD (DRAPES) ×2 IMPLANT
ELECT CAUTERY BLADE TIP 2.5 (TIP) ×2
ELECT REM PT RETURN 9FT ADLT (ELECTROSURGICAL) ×2
ELECTRODE CAUTERY BLDE TIP 2.5 (TIP) ×1 IMPLANT
ELECTRODE REM PT RTRN 9FT ADLT (ELECTROSURGICAL) ×1 IMPLANT
GAUZE 4X4 16PLY ~~LOC~~+RFID DBL (SPONGE) ×2 IMPLANT
GAUZE SPONGE 4X4 12PLY STRL (GAUZE/BANDAGES/DRESSINGS) ×2 IMPLANT
GLOVE SURG SYN 7.0 (GLOVE) ×2 IMPLANT
GLOVE SURG SYN 7.0 PF PI (GLOVE) ×1 IMPLANT
GLOVE SURG SYN 7.5  E (GLOVE) ×1
GLOVE SURG SYN 7.5 E (GLOVE) ×1 IMPLANT
GLOVE SURG SYN 7.5 PF PI (GLOVE) ×1 IMPLANT
GOWN STRL REUS W/ TWL LRG LVL3 (GOWN DISPOSABLE) ×2 IMPLANT
GOWN STRL REUS W/TWL LRG LVL3 (GOWN DISPOSABLE) ×4
IV CATH ANGIO 14GX1.88 NO SAFE (IV SOLUTION) ×2 IMPLANT
KIT TURNOVER KIT A (KITS) ×2 IMPLANT
LABEL OR SOLS (LABEL) ×2 IMPLANT
MANIFOLD NEPTUNE II (INSTRUMENTS) ×2 IMPLANT
NEEDLE HYPO 22GX1.5 SAFETY (NEEDLE) ×2 IMPLANT
NS IRRIG 500ML POUR BTL (IV SOLUTION) ×2 IMPLANT
PACK BASIN MINOR ARMC (MISCELLANEOUS) ×2 IMPLANT
PANTS MESH DISP 2XL (UNDERPADS AND DIAPERS) ×2 IMPLANT
PUNCH BIOPSY 3 (MISCELLANEOUS) IMPLANT
PUNCH BIOPSY 4MM (MISCELLANEOUS)
PUNCH BIOPSY DERMAL 6MM STRL (MISCELLANEOUS) IMPLANT
PUNCH BIOPSY DISP 4 (MISCELLANEOUS) IMPLANT
SOL PREP PVP 2OZ (MISCELLANEOUS) ×2
SOLUTION PREP PVP 2OZ (MISCELLANEOUS) ×1 IMPLANT
SUT ETHILON 2 0 FS 18 (SUTURE) ×1 IMPLANT
SUT VIC AB 2-0 SH 27 (SUTURE) ×2
SUT VIC AB 2-0 SH 27XBRD (SUTURE) ×2 IMPLANT
SUT VIC AB 3-0 SH 27 (SUTURE) ×2
SUT VIC AB 3-0 SH 27X BRD (SUTURE) ×2 IMPLANT
SYR 20ML LL LF (SYRINGE) ×2 IMPLANT
SYR BULB IRRIG 60ML STRL (SYRINGE) ×2 IMPLANT
WATER STERILE IRR 500ML POUR (IV SOLUTION) ×1 IMPLANT

## 2021-08-28 NOTE — Interval H&P Note (Signed)
History and Physical Interval Note:  08/28/2021 7:17 AM  Paige Mitchell  has presented today for surgery, with the diagnosis of Pilonidal cyst.  The various methods of treatment have been discussed with the patient and family. After consideration of risks, benefits and other options for treatment, the patient has consented to  Procedure(s) with comments: CYST EXCISION PILONIDAL EXTENSIVE (N/A) - Provider requesting 1.5 hours / 90 minutes for procedure. as a surgical intervention.  The patient's history has been reviewed, patient examined, no change in status, stable for surgery.  I have reviewed the patient's chart and labs.  Questions were answered to the patient's satisfaction.     Paige Mitchell

## 2021-08-28 NOTE — Anesthesia Postprocedure Evaluation (Signed)
Anesthesia Post Note  Patient: Paige Mitchell  Procedure(s) Performed: CYST EXCISION PILONIDAL EXTENSIVE (Buttocks)  Patient location during evaluation: PACU Anesthesia Type: General Level of consciousness: awake and oriented Pain management: satisfactory to patient Vital Signs Assessment: post-procedure vital signs reviewed and stable Respiratory status: nonlabored ventilation and respiratory function stable Cardiovascular status: stable Anesthetic complications: no   No notable events documented.   Last Vitals:  Vitals:   08/28/21 0915 08/28/21 0924  BP: 108/84 (!) 132/56  Pulse: 82 (!) 108  Resp: (!) 21 20  Temp: 36.4 C (!) 36.2 C  SpO2: 100% 100%    Last Pain:  Vitals:   08/28/21 0924  TempSrc: Temporal  PainSc: 0-No pain                 VAN STAVEREN,Iraida Cragin

## 2021-08-28 NOTE — Discharge Instructions (Signed)

## 2021-08-28 NOTE — Transfer of Care (Signed)
Immediate Anesthesia Transfer of Care Note  Patient: Paige Mitchell  Procedure(s) Performed: CYST EXCISION PILONIDAL EXTENSIVE (Buttocks)  Patient Location: PACU  Anesthesia Type:General  Level of Consciousness: drowsy  Airway & Oxygen Therapy: Patient Spontanous Breathing and Patient connected to face mask oxygen  Post-op Assessment: Report given to RN and Post -op Vital signs reviewed and stable  Post vital signs: Reviewed and stable  Last Vitals:  Vitals Value Taken Time  BP 123/75 08/28/21 0847  Temp    Pulse 95 08/28/21 0849  Resp 28 08/28/21 0850  SpO2 100 % 08/28/21 0849  Vitals shown include unvalidated device data.  Last Pain:  Vitals:   08/28/21 0622  TempSrc: Oral         Complications: No notable events documented.

## 2021-08-28 NOTE — Anesthesia Preprocedure Evaluation (Addendum)
Anesthesia Evaluation  Patient identified by MRN, date of birth, ID band Patient awake    Reviewed: Allergy & Precautions, NPO status , Patient's Chart, lab work & pertinent test results  Airway Mallampati: II  TM Distance: >3 FB Neck ROM: Full    Dental  (+) Teeth Intact   Pulmonary neg pulmonary ROS, asthma , former smoker,    Pulmonary exam normal breath sounds clear to auscultation       Cardiovascular Exercise Tolerance: Good negative cardio ROS Normal cardiovascular exam Rhythm:Regular     Neuro/Psych negative neurological ROS  negative psych ROS   GI/Hepatic negative GI ROS, Neg liver ROS,   Endo/Other  negative endocrine ROS  Renal/GU negative Renal ROS  negative genitourinary   Musculoskeletal negative musculoskeletal ROS (+)   Abdominal Normal abdominal exam  (+)   Peds negative pediatric ROS (+)  Hematology negative hematology ROS (+)   Anesthesia Other Findings Past Medical History: No date: Asthma  Past Surgical History: 2014: COLONOSCOPY WITH ESOPHAGOGASTRODUODENOSCOPY (EGD)     Comment:  [facility] No date: INCISE AND DRAIN ABCESS     Comment:  Right Groin- Positive MRSA per patient 01/2014: PILONIDAL CYST DRAINAGE     Comment:  Dr. Juliann Pulse 03/30/2016: PILONIDAL CYST DRAINAGE; N/A     Comment:  Procedure: IRRIGATION AND DEBRIDEMENT PILONIDAL CYST.               Prone position;  Surgeon: Ricarda Frame, MD;  Location:              [facility] ORS;  Service: General;  Laterality: N/A; 01/07/2021: PILONIDAL CYST DRAINAGE; N/A     Comment:  Procedure: IRRIGATION AND DEBRIDEMENT PILONIDAL CYST;                Surgeon: Henrene Dodge, MD;  Location: [facility] ORS;                Service: General;  Laterality: N/A;  Prone position  BMI    Body Mass Index: 28.00 kg/m      Reproductive/Obstetrics negative OB ROS                            Anesthesia Physical Anesthesia Plan  ASA:  2  Anesthesia Plan: General   Post-op Pain Management:    Induction: Intravenous  PONV Risk Score and Plan: 1 and Ondansetron and Dexamethasone  Airway Management Planned: Oral ETT  Additional Equipment:   Intra-op Plan:   Post-operative Plan: Extubation in OR  Informed Consent: I have reviewed the patients History and Physical, chart, labs and discussed the procedure including the risks, benefits and alternatives for the proposed anesthesia with the patient or authorized representative who has indicated his/her understanding and acceptance.     Dental Advisory Given  Plan Discussed with: CRNA and Surgeon  Anesthesia Plan Comments:         Anesthesia Quick Evaluation

## 2021-08-28 NOTE — Op Note (Signed)
  Procedure Date:  08/28/2021  Pre-operative Diagnosis:  History of pilonidal abscess  Post-operative Diagnosis: History of pilonidal abscess  Procedure:  Pilonidal cyst excision  Surgeon:  Howie Ill, MD  Anesthesia:  General endotracheal  Estimated Blood Loss:  5 ml  Specimens:  Pilonidal cyst  Complications:  None  Indications for Procedure:  This is a 26 y.o. female with a history of pilonidal abscess, s/p I&D in the past, now presents for formal excision.  The options of surgery versus observation were reviewed with the patient and/or family. The risks of bleeding, infection, recurrence of symptoms, abscess or infection, were all discussed with the patient and she was willing to proceed.  Description of Procedure: The patient was correctly identified in the preoperative area and brought into the operating room.  The patient was placed supine with VTE prophylaxis in place.  Appropriate time-outs were performed.  Anesthesia was induced and the patient was intubated.  The patient was then placed in prone position. Appropriate antibiotics were infused.  The pilonidal area was prepped and draped in usual sterile fashion.  A 5 cm elliptical incision was made, incorporating the pilonidal cyst and pits.  Cautery was used to dissect down the subcutaneous tissues to the cyst and the cyst with skin was excised intact using cautery.  This was sent to pathology.  Then skin flaps were created using cautery.  The cavity was irrigated and hemostasis was assured.  Exparel mixed with 0.5% bupivacaine with epi was infiltrated into the skin and subcutaneous tissue of the cavity.  The cavity was then closed in multiple layers using 2-0 Vicryl sutures, 3-0 Vicryl sutures, and 2-0 Nylon sutures for the skin.  The incision was cleaned and dressed with gauze and tape.  The patient was then placed back on supine position, emerged from anesthesia, extubated, and brought to the recovery room for further  management.  The patient tolerated the procedure well and all counts were correct at the end of the case.   Howie Ill, MD

## 2021-08-28 NOTE — Anesthesia Procedure Notes (Signed)
Procedure Name: Intubation Date/Time: 08/28/2021 7:30 AM  Performed by: Beverely Low, CRNAPre-anesthesia Checklist: Patient identified, Emergency Drugs available, Suction available and Patient being monitored Patient Re-evaluated:Patient Re-evaluated prior to induction Oxygen Delivery Method: Circle system utilized Preoxygenation: Pre-oxygenation with 100% oxygen Induction Type: IV induction Ventilation: Mask ventilation without difficulty Laryngoscope Size: Mac and 3 Grade View: Grade I Tube type: Oral Tube size: 7.0 mm Number of attempts: 1 Airway Equipment and Method: Stylet and Oral airway Placement Confirmation: ETT inserted through vocal cords under direct vision, positive ETCO2 and breath sounds checked- equal and bilateral Secured at: 21 cm Tube secured with: Tape Dental Injury: Teeth and Oropharynx as per pre-operative assessment  Comments: Intubated by Everlene Other SRNA

## 2021-08-29 ENCOUNTER — Encounter: Payer: Self-pay | Admitting: Surgery

## 2021-08-29 LAB — SURGICAL PATHOLOGY

## 2021-09-01 ENCOUNTER — Other Ambulatory Visit: Payer: Self-pay | Admitting: Surgery

## 2021-09-01 ENCOUNTER — Telehealth: Payer: Self-pay | Admitting: *Deleted

## 2021-09-01 MED ORDER — HYDROCODONE-ACETAMINOPHEN 5-325 MG PO TABS: 1.0000 | ORAL_TABLET | ORAL | 0 refills | Status: DC | PRN

## 2021-09-02 ENCOUNTER — Ambulatory Visit (INDEPENDENT_AMBULATORY_CARE_PROVIDER_SITE_OTHER): Payer: Medicaid Other | Admitting: Physician Assistant

## 2021-09-02 ENCOUNTER — Other Ambulatory Visit: Payer: Self-pay

## 2021-09-02 ENCOUNTER — Encounter: Payer: Self-pay | Admitting: Physician Assistant

## 2021-09-02 VITALS — BP 109/77 | HR 86 | Temp 98.2°F | Ht 63.0 in | Wt 155.2 lb

## 2021-09-02 DIAGNOSIS — Z09 Encounter for follow-up examination after completed treatment for conditions other than malignant neoplasm: Secondary | ICD-10-CM

## 2021-09-02 DIAGNOSIS — L0501 Pilonidal cyst with abscess: Secondary | ICD-10-CM

## 2021-09-02 MED ORDER — HYDROCODONE-ACETAMINOPHEN 5-325 MG PO TABS: 1.0000 | ORAL_TABLET | ORAL | 0 refills | Status: DC | PRN

## 2021-09-05 ENCOUNTER — Encounter: Payer: Self-pay | Admitting: Surgery

## 2021-09-05 ENCOUNTER — Ambulatory Visit (INDEPENDENT_AMBULATORY_CARE_PROVIDER_SITE_OTHER): Payer: Medicaid Other | Admitting: Surgery

## 2021-09-05 VITALS — BP 112/75 | HR 63 | Temp 98.6°F | Ht 63.0 in | Wt 155.0 lb

## 2021-09-05 DIAGNOSIS — Z09 Encounter for follow-up examination after completed treatment for conditions other than malignant neoplasm: Secondary | ICD-10-CM

## 2021-09-05 DIAGNOSIS — L0501 Pilonidal cyst with abscess: Secondary | ICD-10-CM

## 2021-09-05 MED ORDER — AMOXICILLIN-POT CLAVULANATE 875-125 MG PO TABS: 1.0000 | ORAL_TABLET | Freq: Two times a day (BID) | ORAL | 0 refills | Status: AC

## 2021-09-05 NOTE — Patient Instructions (Signed)
Pack the area once a day. You may change the top dressing multiple times a day as needed. Follow up here next week.  Call if you have any questions or problems.

## 2021-09-05 NOTE — Progress Notes (Signed)
09/05/2021  HPI: Paige Mitchell is a 26 y.o. female s/p excision of pilonidal cyst on 08/08/2021.  Patient presents today for follow-up.  She was seen on 6/27 due to worsening pain at the incision.  We will change her oxycodone to Vicodin.  The patient reports that her pain is improving a little bit but she thinks it is more because overall she is starting to heal more.  She has noticed some drainage from the top of the incision when doing dressing changes.  Vital signs: BP 112/75   Pulse 63   Temp 98.6 F (37 C)   Ht 5\' 3"  (1.6 m)   Wt 155 lb (70.3 kg)   SpO2 100%   BMI 27.46 kg/m    Physical Exam: Constitutional: No acute distress Skin: Gluteal cleft incision is more edematous without any significant erythema with some serosanguineous fluid leaking from the top portion of the incision.  The top half of the wound edges have become separated despite of the nylon sutures present.  There are 3.  Sutures were removed revealing an open wound.  This was probed with Q-tip having copious amount of what looks like a seroma fluid.  No purulence.  The wound was dried and then packed with quarter inch iodoform gauze and dressed with dry gauze, ABD, and tape.  Assessment/Plan: This is a 26 y.o. female s/p excision of pilonidal cyst.  - Discussed with the patient the top half of the incision had a dehiscence despite of the nylon sutures there which was leading to the drainage of some fluid.  I think it is this that was causing her the worst pain and that the sutures were pulling too much as the wound was separating and also with more pressure was building up due to the seroma.  Discussed with the patient and now that the top half of the wound is open, the pressure and pain should start improving.  The wound does not appear infected and the fluid appears to be a seroma fluid instead of pus.  However as a precaution, I will give the patient a prescription for Augmentin for 7 days.  Instructed patient that she  will need to do packing dressing changes as she had been before after her initial I&D.  She understands this. - Follow-up next week to check on the wound healing and potentially remove the inferior 3 sutures. - All of her questions have been answered.   30, MD [facility]

## 2021-09-12 ENCOUNTER — Encounter: Payer: Self-pay | Admitting: Surgery

## 2021-09-12 ENCOUNTER — Ambulatory Visit (INDEPENDENT_AMBULATORY_CARE_PROVIDER_SITE_OTHER): Payer: Medicaid Other | Admitting: Surgery

## 2021-09-12 ENCOUNTER — Other Ambulatory Visit: Payer: Self-pay

## 2021-09-12 VITALS — BP 120/77 | HR 73 | Temp 98.9°F | Ht 63.0 in | Wt 156.2 lb

## 2021-09-12 DIAGNOSIS — Z09 Encounter for follow-up examination after completed treatment for conditions other than malignant neoplasm: Secondary | ICD-10-CM

## 2021-09-12 DIAGNOSIS — L0501 Pilonidal cyst with abscess: Secondary | ICD-10-CM

## 2021-09-12 MED ORDER — HYDROCODONE-ACETAMINOPHEN 5-325 MG PO TABS: 1.0000 | ORAL_TABLET | ORAL | 0 refills | Status: DC | PRN

## 2021-09-12 NOTE — Progress Notes (Signed)
09/12/2021  HPI: Paige Mitchell is a 26 y.o. female s/p excision of pilonidal cyst on 08/28/2021.  Patient was last seen on 09/05/2021 at which time the upper sutures were removed as the upper portion of the wound had opened up.  Seroma was drained and the wound has been packed with quarter inch iodoform gauze.  Patient presents today and reports that she has been initially doing better with less pain but more recently she fell and hit her backside down the steps and now the area is more sore.  Her family has been doing the dressing changes for her.  Vital signs: BP 120/77   Pulse 73   Temp 98.9 F (37.2 C) (Oral)   Ht 5\' 3"  (1.6 m)   Wt 156 lb 3.2 oz (70.9 kg)   SpO2 99%   BMI 27.67 kg/m    Physical Exam: Constitutional: No acute distress Skin: The patient's inferior portion of the wound has opened up also which is pulling very tightly on the sutures.  The 3 sutures were cut revealing now an open majority of the wound.  The wound edges have very healthy granulation tissue and there is no further purulence or any seroma fluid.  There is no significant erythema or any induration at the skin edges.  The wound was then packed with half-inch iodoform gauze and covered with dry gauze, ABD pad, and tape.  Assessment/Plan: This is a 26 y.o. female s/p excision of pilonidal cyst.  - Discussed with patient that likely with her fall, the rest of the wound open more and so the 3 remaining sutures were removed.  Now the wound is open almost fully but the wound edges and base are healthy with good granulation tissue.  Currently no further antibiotics are needed but instructed the patient that we would be packing the wound with half-inch iodoform instead of quarter inch.  She should continue doing this once daily. - Follow-up in about 2 weeks.   30, MD [facility]

## 2021-09-12 NOTE — Addendum Note (Signed)
Addended by: Myrtie Hawk on: 09/12/2021 02:37 PM   Modules accepted: Orders

## 2021-09-12 NOTE — Patient Instructions (Addendum)
Please continue to pack the wound daily.    Pilonidal Cyst Drainage  A pilonidal cyst is a fluid-filled sac that forms beneath the skin near the tailbone, at the top of the crease of the buttocks (pilonidal area). Incision and drainage is a procedure to open and drain a pilonidal cyst. You may need this procedure if the cyst becomes painful, swollen, or infected (pilonidal abscess). There are three types of procedures to drain a pilonidal cyst. The type of procedure you have will depend on the size, location, and severity of your cyst. You may have: Incision and drainage with wound packing. Wound packing involves placing a moistened sterile packing material (gauze) into the incision and then covering the wound with an outer bandage (dressing). This method lets the wound continue to drain and helps the wound heal from the inside out. Marsupialization. This involves opening and draining the cyst, and then stitching (suturing) the wound so that it stays open while it heals. This method helps deep wounds heal from the inside out. Incision and drainage without wound packing. This incision is closed and covered with a dressing on the outside. Tell a health care provider about: Any allergies you have. All medicines you are taking, including vitamins, herbs, eye drops, creams, and over-the-counter medicines. Any problems you or family members have had with anesthetic medicines. Any blood disorders you have. Any surgeries you have had. Any medical conditions you have. Whether you are pregnant or may be pregnant. What are the risks? Generally, this is a safe procedure. However, problems may occur, including: Infection. Bleeding. Allergic reactions to medicines. What happens before the procedure? Medicines Ask your health care provider about: Changing or stopping your regular medicines. This is especially important if you are taking diabetes medicines or blood thinners. Taking medicines such as  aspirin and ibuprofen. These medicines can thin your blood. Do not take these medicines before your procedure unless your health care provider tells you to take them. Taking over-the-counter medicines, vitamins, herbs, and supplements. Staying hydrated Follow instructions from your health care provider about hydration, which may include: Up to 2 hours before the procedure - you may continue to drink clear liquids, such as water, clear fruit juice, black coffee, and plain tea.  Eating and drinking restrictions Follow instructions from your health care provider about eating and drinking, which may include: 8 hours before the procedure - stop eating heavy meals or foods, such as meat, fried foods, or fatty foods. 6 hours before the procedure - stop eating light meals or foods, such as toast or cereal. 6 hours before the procedure - stop drinking milk or drinks that contain milk. 2 hours before the procedure - stop drinking clear liquids. General instructions Follow instructions from your health care provider about bathing the night before or the morning before your procedure. Ask your health care provider what steps will be taken to help prevent infection. These may include: Removing hair at the surgery site. Washing skin with a germ-killing soap. Antibiotic medicine. Plan to have someone take you home from the hospital or clinic. Plan to have a responsible adult care for you for at least 24 hours after you leave the hospital or clinic. This is important. You will need to have help from a caregiver after the procedure in order to manage wound care and dressing changes. Depending on the type of procedure you have, this may be needed for a period of days or weeks after your procedure. What happens during the procedure? An  IV may be inserted into one of your veins. You will be given one or more of the following: A medicine to help you relax (sedative). A medicine to numb the area (local  anesthetic). A medicine to make you fall asleep (general anesthetic). You will lie down on your stomach. Tape may be used to spread your buttocks, if needed. Your pilonidal area will be cleaned with a germ-killing solution. An incision will be made in the cyst. A tube with a light and camera on the end (probe) may be used to see how deep the wound is. Fluid or pus inside the cyst will be drained. The cyst will be flushed out (irrigated) with a germ-free (sterile) solution. The rest of the procedure will depend on what type of procedure you are having. If you are having incision and drainage with wound packing: Sterile packing material will be placed into the wound. This keeps the wound open so that it can continue to drain after surgery. The area will be covered with a bandage (dressing). If you are having marsupialization: The edges of the incision will be sutured to the surrounding skin to keep the wound open. A dressing will be placed over the wound. If you are having incision and drainage without wound packing: Some tissue around the opened cyst may be removed. The incision may be closed with sutures, skin glue, or adhesive strips. The incision will be covered with a sterile dressing. These procedures may vary among health care providers and hospitals. What happens after the procedure? Your blood pressure, heart rate, breathing rate, and blood oxygen level will be monitored until you leave the hospital or clinic. You will be given pain medicine if you need it. Do not drive for 24 hours if you were given a sedative during your procedure. You and your caregiver will be given instructions about how to care for your wound and any dressings you may have. Summary A pilonidal cyst is a fluid-filled sac that forms beneath the skin near the tailbone, at the top of the crease of the buttocks (pilonidal area). Incision and drainage is a procedure to open and drain a pilonidal cyst. The type of  procedure you have will depend on the size, location, and severity of your cyst. You may need this procedure if your cyst becomes painful, swollen, or infected (pilonidal abscess). This information is not intended to replace advice given to you by your health care provider. Make sure you discuss any questions you have with your health care provider. Document Revised: 01/04/2020 Document Reviewed: 01/04/2020 Elsevier Patient Education  2023 [employer].

## 2021-09-16 ENCOUNTER — Telehealth: Payer: Self-pay | Admitting: *Deleted

## 2021-09-16 NOTE — Telephone Encounter (Signed)
Patient had surgery on 08/28/21 Dr Aleen Campi cyst excision pilonidal and she would like to come pick up a note stating that she can return to work 3 days a week and for a total of 5 hours those days, and she would also like in the note to put what restricitons that she has. Please call and advise

## 2021-09-16 NOTE — Telephone Encounter (Signed)
Return to work note placed at receptionist desk for patient to patient to pick up.

## 2021-09-17 ENCOUNTER — Ambulatory Visit: Payer: Medicaid Other

## 2021-09-24 ENCOUNTER — Encounter: Payer: Self-pay | Admitting: Surgery

## 2021-09-24 ENCOUNTER — Ambulatory Visit (LOCAL_COMMUNITY_HEALTH_CENTER): Payer: Medicaid Other | Admitting: Advanced Practice Midwife

## 2021-09-24 ENCOUNTER — Ambulatory Visit (INDEPENDENT_AMBULATORY_CARE_PROVIDER_SITE_OTHER): Payer: Medicaid Other | Admitting: Surgery

## 2021-09-24 ENCOUNTER — Encounter: Payer: Self-pay | Admitting: Advanced Practice Midwife

## 2021-09-24 VITALS — BP 125/77 | Ht 63.0 in | Wt 156.6 lb

## 2021-09-24 VITALS — BP 109/72 | HR 74 | Temp 98.4°F | Ht 63.0 in | Wt 155.0 lb

## 2021-09-24 DIAGNOSIS — Z3200 Encounter for pregnancy test, result unknown: Secondary | ICD-10-CM

## 2021-09-24 DIAGNOSIS — Z309 Encounter for contraceptive management, unspecified: Secondary | ICD-10-CM

## 2021-09-24 DIAGNOSIS — L0501 Pilonidal cyst with abscess: Secondary | ICD-10-CM

## 2021-09-24 DIAGNOSIS — Z09 Encounter for follow-up examination after completed treatment for conditions other than malignant neoplasm: Secondary | ICD-10-CM

## 2021-09-24 DIAGNOSIS — Z720 Tobacco use: Secondary | ICD-10-CM | POA: Insufficient documentation

## 2021-09-24 DIAGNOSIS — Z3202 Encounter for pregnancy test, result negative: Secondary | ICD-10-CM

## 2021-09-24 DIAGNOSIS — E663 Overweight: Secondary | ICD-10-CM | POA: Insufficient documentation

## 2021-09-24 LAB — PREGNANCY, URINE: Preg Test, Ur: NEGATIVE

## 2021-09-24 NOTE — Patient Instructions (Signed)
Continue to pack the area daily. We will send in a refill of your pain medication.   Follow up here in 3 weeks.

## 2021-09-24 NOTE — Progress Notes (Signed)
   [facility]  Subjective:  Paige Mitchell is a 26 y.o. SWF nullip vaper being seen today for PT only. Pt last DMPA 06/30/21 and had pilonidal cyst surgery 08/28/21. Refusing birth control, physical, just wants PT today. Not working, not in school. Living with female cousin and her son. Last sex 09/22/21 without condom; with current partner x 9 mo; 1 partner in last 3 mo. Vapes currently. Last cigar 02/2021. Last MJ 2019. Last ETOH 07/2021  Chief Complaint  Patient presents with   Possible Pregnancy    Pt on DEPO but had surgery and states "they gave me some medicine that made my birth control inactive" Wants to stop the DEPO    HPI   Does the patient have a current or past history of drug use? Yes   No components found for: "HCV"]   Health Maintenance Due  Topic Date Due   COVID-19 Vaccine (1) Never done   HPV VACCINES (1 - 2-dose series) Never done   TETANUS/TDAP  Never done    ROS  The following portions of the patient's history were reviewed and updated as appropriate: allergies, current medications, past family history, past medical history, past social history, past surgical history and problem list. Problem list updated.   See flowsheet for other program required questions.  Objective:   Vitals:   09/24/21 0837  BP: 125/77  Weight: 156 lb 9.6 oz (71 kg)  Height: 5\' 3"  (1.6 m)    Physical Exam Vitals and nursing note reviewed.  Constitutional:      Appearance: Normal appearance.  HENT:     Head: Normocephalic.  Eyes:     Conjunctiva/sclera: Conjunctivae normal.  Pulmonary:     Effort: Pulmonary effort is normal.  Genitourinary:    Comments: Physical due 01/2022 Pt declines exam/STD exam, etc Musculoskeletal:        General: Normal range of motion.  Skin:    General: Skin is warm and dry.  Neurological:     Mental Status: She is alert. Mental status is at baseline.       Assessment  and Plan:  Paige Mitchell is a 26 y.o. female presenting to the [facility] Department for a Women's Health problem visit  1. Possible pregnancy, not confirmed PT neg Pt refuses physical, birth control, just wants PT today=neg - Pregnancy, urine     No follow-ups on file.  Future Appointments  Date Time Provider Department Center  09/24/2021  2:15 PM 09/26/2021, MD [facility] None    Henrene Dodge, CNM

## 2021-09-24 NOTE — Progress Notes (Signed)
Pt here for UPT only. Denies wanting STI testing or contraception. Urine pregnancy done and is negative.  Lethea Killings RN

## 2021-09-25 ENCOUNTER — Encounter: Payer: Self-pay | Admitting: Surgery

## 2021-09-25 MED ORDER — HYDROCODONE-ACETAMINOPHEN 5-325 MG PO TABS: 1.0000 | ORAL_TABLET | Freq: Four times a day (QID) | ORAL | 0 refills | Status: DC | PRN

## 2021-09-25 NOTE — Progress Notes (Signed)
09/24/2021  HPI: Paige Mitchell is a 26 y.o. female s/p excision of pilonidal cyst on 08/28/2021.  Patient's wound is fully open and she's been packing with 1/2 inch iodoform.  Wound is doing ok, with pain continuing to improve.  Vital signs: BP 109/72   Pulse 74   Temp 98.4 F (36.9 C)   Ht 5\' 3"  (1.6 m)   Wt 155 lb (70.3 kg)   SpO2 99%   BMI 27.46 kg/m    Physical Exam: Constitutional: No acute distress Skin:  Healthy granulation tissue and wound edges.  No purulence, erythema, induration.  Wound packed with 1/2 inch iodoform, dressed with gauze/tape.  Assessment/Plan: This is a 26 y.o. female s/p excision of pilonidal cyst.  - Patient's wound is healing well, without complications. Continue current dressing changes. - Follow-up in about 3 weeks.   30, MD [facility]

## 2021-10-13 ENCOUNTER — Encounter: Payer: Self-pay | Admitting: Surgery

## 2021-10-13 ENCOUNTER — Ambulatory Visit (INDEPENDENT_AMBULATORY_CARE_PROVIDER_SITE_OTHER): Payer: Medicaid Other | Admitting: Surgery

## 2021-10-13 VITALS — BP 102/71 | HR 73 | Temp 98.8°F | Ht 63.0 in | Wt 154.0 lb

## 2021-10-13 DIAGNOSIS — Z09 Encounter for follow-up examination after completed treatment for conditions other than malignant neoplasm: Secondary | ICD-10-CM

## 2021-10-13 DIAGNOSIS — L0501 Pilonidal cyst with abscess: Secondary | ICD-10-CM

## 2021-10-13 NOTE — Progress Notes (Signed)
10/13/2021  HPI: Paige Mitchell is a 26 y.o. female s/p excision of pilonidal cyst on 08/28/21.  She's been undergoing packing dressing changes as the wound fully opened.  She reports that recently the wound has become smaller in size that she's not able to really pack the wound anymore, so she's using outer gauze dressing changes instead.  Denies any worsening pain.  Vital signs: BP 102/71   Pulse 73   Temp 98.8 F (37.1 C)   Ht 5\' 3"  (1.6 m)   Wt 154 lb (69.9 kg)   LMP 08/27/2021 (Approximate)   SpO2 99%   BMI 27.28 kg/m    Physical Exam: Constitutional: No acute distress Skin:  Gluteal cleft wound is healing well, measures about 1.5 x 0.7 cm, with healthy granulation tissue.  No significant room for packing anymore.  Applied dry gauze dressing.  Assessment/Plan: This is a 26 y.o. female s/p pilonidal cyst excision.  --The wound is indeed healing well and getting smaller.  No room for packing to stay in place.  OK to do outer dry gauze dressing only.  Change daily and as needed. --Advised to still not submerge the wound until it's fully closed. --Follow up as needed.   30, MD [facility]

## 2021-10-13 NOTE — Patient Instructions (Signed)
Continue to change the gauze dressing daily.   You may follow up as needed.   Please call and ask to speak with a nurse if you develop questions or concerns.

## 2021-11-25 ENCOUNTER — Ambulatory Visit (INDEPENDENT_AMBULATORY_CARE_PROVIDER_SITE_OTHER): Payer: Medicaid Other | Admitting: Physician Assistant

## 2021-11-25 ENCOUNTER — Other Ambulatory Visit: Payer: Self-pay

## 2021-11-25 ENCOUNTER — Encounter: Payer: Self-pay | Admitting: Physician Assistant

## 2021-11-25 VITALS — BP 107/69 | HR 76 | Temp 98.3°F | Ht 63.0 in | Wt 153.0 lb

## 2021-11-25 DIAGNOSIS — L0501 Pilonidal cyst with abscess: Secondary | ICD-10-CM

## 2021-11-25 DIAGNOSIS — Z09 Encounter for follow-up examination after completed treatment for conditions other than malignant neoplasm: Secondary | ICD-10-CM

## 2021-11-25 NOTE — Patient Instructions (Signed)
Please see your follow up appointment listed below.  °

## 2021-11-25 NOTE — Progress Notes (Signed)
[facility] POST-OP OFFICE VISIT  11/25/2021  HPI: NARIAH MORGANO is a 26 y.o. female s/p excision of pilonidal cyst on 08/28/21 with Dr Hampton Abbot. Last seen on 08/07 and was doing well.   She presents today secondary to the wound not closing. She reports that since her last visit in August she has had a small area, ~1 cm which will not completely close. She feels that it may occasionally close but with movement she feels "like it rips open." She continues to keep this covered with dry gauze, minimal serous output. No fever, chills.   Vital signs: BP 107/69   Pulse 76   Temp 98.3 F (36.8 C) (Oral)   Ht 5\' 3"  (1.6 m)   Wt 153 lb (69.4 kg)   SpO2 99%   BMI 27.10 kg/m    Physical Exam: Constitutional: Well appearing female, NAD Skin: Freda Munro present as chaperone; there is a small ~1 x 0.5 cm x <0.5 cm area to the middle of her previous pilonidal excision which has not completely closed. Wound bed appears to be completely granulation tissue, no drainage, no erythema   Assessment/Plan: This is a 26 y.o. female s/p excision of pilonidal cyst on 08/28/21 with Dr Hampton Abbot    - I applied silver nitrate to the wound bed in an effort to try to facilitate further scarring/closure of the wound. She understands that we will again need to allow this to close by secondary intention.  - Continue to keep this covered with superficial gauze only; no need for packing  - She will follow up in ~2 weeks for wound check; She understands to call with questions/concerns in the interim  -- Edison Simon, PA-C [facility] 11/25/2021, 3:15 PM M-F: 7am - 4pm

## 2021-12-08 ENCOUNTER — Encounter: Payer: Medicaid Other | Admitting: Surgery

## 2021-12-08 ENCOUNTER — Encounter: Payer: Self-pay | Admitting: Surgery

## 2021-12-08 ENCOUNTER — Ambulatory Visit (INDEPENDENT_AMBULATORY_CARE_PROVIDER_SITE_OTHER): Payer: Medicaid Other | Admitting: Surgery

## 2021-12-08 ENCOUNTER — Telehealth: Payer: Self-pay

## 2021-12-08 VITALS — BP 111/78 | HR 78 | Temp 98.0°F | Ht 63.0 in | Wt 154.0 lb

## 2021-12-08 DIAGNOSIS — L0501 Pilonidal cyst with abscess: Secondary | ICD-10-CM

## 2021-12-08 DIAGNOSIS — T8189XA Other complications of procedures, not elsewhere classified, initial encounter: Secondary | ICD-10-CM

## 2021-12-08 DIAGNOSIS — Z09 Encounter for follow-up examination after completed treatment for conditions other than malignant neoplasm: Secondary | ICD-10-CM | POA: Diagnosis not present

## 2021-12-08 NOTE — Patient Instructions (Addendum)
If you are able leave your dressing in and change every other day. Pack a small piece of Calcium Alginate into the wound and cover with a dry gauze.  We will also refer you to the [facility]. They will call you to schedule this appointment.  Follow up here in 2 weeks.   You may try Aleve(Naproxen) for pain control. If you are unable to take this please let us know.

## 2021-12-08 NOTE — Telephone Encounter (Signed)
error 

## 2021-12-08 NOTE — Progress Notes (Signed)
12/08/2021  History of Present Illness: Paige Mitchell is a 26 y.o. female presenting for follow up of an open wound.  She had a pilonidal cyst excision back on 08/28/21 and the wound opened after developing a seroma.  I last saw her on 10/13/21 and the wound was healing appropriately at that point and was almost fully healed, without room for packing.  However, the patient reports that since then, the wound has not healed yet.  It still has some tenderness and open wound and drainage.  She feels it may at times get smaller, but then opens up more. She saw Mr. Olean Ree on 11/25/21 and silver nitrate was applied.  However, the patient feels that did not help and hurt for about 5 days after.    Past Medical History: Past Medical History:  Diagnosis Date   Asthma      Past Surgical History: Past Surgical History:  Procedure Laterality Date   COLONOSCOPY WITH ESOPHAGOGASTRODUODENOSCOPY (EGD)  2014   [facility]   INCISE AND DRAIN ABCESS     Right Groin- Positive MRSA per patient   PILONIDAL CYST DRAINAGE  01/2014   Dr. Rexene Edison   PILONIDAL CYST DRAINAGE N/A 03/30/2016   Procedure: IRRIGATION AND DEBRIDEMENT PILONIDAL CYST. Prone position;  Surgeon: Clayburn Pert, MD;  Location: [facility] ORS;  Service: General;  Laterality: N/A;   PILONIDAL CYST DRAINAGE N/A 01/07/2021   Procedure: IRRIGATION AND DEBRIDEMENT PILONIDAL CYST;  Surgeon: Olean Ree, MD;  Location: [facility] ORS;  Service: General;  Laterality: N/A;  Prone position   PILONIDAL CYST EXCISION N/A 08/28/2021   Procedure: CYST EXCISION PILONIDAL EXTENSIVE;  Surgeon: Olean Ree, MD;  Location: [facility] ORS;  Service: General;  Laterality: N/A;  Provider requesting 1.5 hours / 90 minutes for procedure.    Home Medications: Prior to Admission medications   Medication Sig Start Date End Date Taking? Authorizing Provider  acetaminophen (TYLENOL) 500 MG tablet Take 2 tablets (1,000 mg total) by mouth every 6 (six) hours as needed for mild pain. 08/28/21   Yes Nicky Kras, Jacqulyn Bath, MD  albuterol (PROVENTIL HFA;VENTOLIN HFA) 108 (90 Base) MCG/ACT inhaler Inhale 2 puffs into the lungs every 4 (four) hours as needed for wheezing or shortness of breath. 11/22/16  Yes Cuthriell, Charline Bills, PA-C    Allergies: Allergies  Allergen Reactions   Ibuprofen Hives and Swelling    Review of Systems: Review of Systems  Constitutional:  Negative for chills and fever.  Respiratory:  Negative for shortness of breath.   Cardiovascular:  Negative for chest pain.  Gastrointestinal:  Negative for abdominal pain, nausea and vomiting.  Skin:        Open gluteal cleft wound    Physical Exam BP 111/78   Pulse 78   Temp 98 F (36.7 C)   Ht 5\' 3"  (1.6 m)   Wt 154 lb (69.9 kg)   SpO2 99%   BMI 27.28 kg/m  CONSTITUTIONAL: No acute distress HEENT:  Normocephalic, atraumatic, extraocular motion intact. RESPIRATORY:  Normal respiratory effort without pathologic use of accessory muscles. CARDIOVASCULAR: Regular rhythm and rate. SKIN:  The patient has a small open wound in the gluteal cleft at the site of pilonidal cyst excision.  The wound measures about 1 cm x 0.5 cm, with healthy granulation tissue.  It is tender when pushing on it or probing with qtip, but there is no purulence, erythema, induration or any sign of infection. NEUROLOGIC:  Motor and sensation is grossly normal.  Cranial nerves are grossly intact. PSYCH:  Alert and oriented to person, place and time. Affect is normal.  Assessment and Plan: This is a 26 y.o. female with post-op non-healing wound  --Discussed with the patient that the wound indeed is open still but there is no evidence of infection and the wound bed has very healthy granulation tissue.  I suspect that it's the location that is not allowing for the wound to heal as easily.  Since regular gauze or strip gauze has not helped to heal the wound and it continues to drain, will try calcium alginate dressing instead of regular gauze.  Instructed  on daily dressing changes.   --Will also [facility] help with evaluation/management of her open wound as they can provide with better insight on what dressings would be most beneficial for her. --Follow up in 2 weeks.  I spent 20 minutes dedicated to the care of this patient on the date of this encounter to include pre-visit review of records, face-to-face time with the patient discussing diagnosis and management, and any post-visit coordination of care.   Howie Ill, MD [facility]

## 2021-12-22 ENCOUNTER — Ambulatory Visit: Payer: Medicaid Other | Admitting: Surgery

## 2022-02-23 ENCOUNTER — Other Ambulatory Visit: Payer: Self-pay

## 2022-02-23 ENCOUNTER — Observation Stay
Admission: EM | Admit: 2022-02-23 | Discharge: 2022-02-25 | Disposition: A | Discharge status: Home / Self Care | Payer: Medicaid Other | Attending: Hospitalist | Admitting: Hospitalist

## 2022-02-23 ENCOUNTER — Emergency Department: Payer: Medicaid Other

## 2022-02-23 DIAGNOSIS — J4520 Mild intermittent asthma, uncomplicated: Secondary | ICD-10-CM | POA: Diagnosis not present

## 2022-02-23 DIAGNOSIS — J45909 Unspecified asthma, uncomplicated: Secondary | ICD-10-CM | POA: Diagnosis not present

## 2022-02-23 DIAGNOSIS — Z1152 Encounter for screening for COVID-19: Secondary | ICD-10-CM | POA: Insufficient documentation

## 2022-02-23 DIAGNOSIS — N10 Acute tubulo-interstitial nephritis: Principal | ICD-10-CM | POA: Insufficient documentation

## 2022-02-23 DIAGNOSIS — F17290 Nicotine dependence, other tobacco product, uncomplicated: Secondary | ICD-10-CM | POA: Diagnosis not present

## 2022-02-23 DIAGNOSIS — N12 Tubulo-interstitial nephritis, not specified as acute or chronic: Secondary | ICD-10-CM

## 2022-02-23 DIAGNOSIS — Z79899 Other long term (current) drug therapy: Secondary | ICD-10-CM | POA: Insufficient documentation

## 2022-02-23 DIAGNOSIS — F17200 Nicotine dependence, unspecified, uncomplicated: Secondary | ICD-10-CM | POA: Diagnosis not present

## 2022-02-23 DIAGNOSIS — A419 Sepsis, unspecified organism: Secondary | ICD-10-CM | POA: Diagnosis not present

## 2022-02-23 DIAGNOSIS — R109 Unspecified abdominal pain: Secondary | ICD-10-CM | POA: Diagnosis present

## 2022-02-23 DIAGNOSIS — E663 Overweight: Secondary | ICD-10-CM | POA: Diagnosis present

## 2022-02-23 LAB — CBC
HCT: 35.6 % — ABNORMAL LOW (ref 36.0–46.0)
Hemoglobin: 11.5 g/dL — ABNORMAL LOW (ref 12.0–15.0)
MCH: 27.8 pg (ref 26.0–34.0)
MCHC: 32.3 g/dL (ref 30.0–36.0)
MCV: 86 fL (ref 80.0–100.0)
Platelets: 222 10*3/uL (ref 150–400)
RBC: 4.14 MIL/uL (ref 3.87–5.11)
RDW: 13.7 % (ref 11.5–15.5)
WBC: 12 10*3/uL — ABNORMAL HIGH (ref 4.0–10.5)
nRBC: 0 % (ref 0.0–0.2)

## 2022-02-23 LAB — COMPREHENSIVE METABOLIC PANEL
ALT: 15 U/L (ref 0–44)
AST: 25 U/L (ref 15–41)
Albumin: 4.2 g/dL (ref 3.5–5.0)
Alkaline Phosphatase: 46 U/L (ref 38–126)
Anion gap: 8 (ref 5–15)
BUN: 11 mg/dL (ref 6–20)
CO2: 21 mmol/L — ABNORMAL LOW (ref 22–32)
Calcium: 9.4 mg/dL (ref 8.9–10.3)
Chloride: 108 mmol/L (ref 98–111)
Creatinine, Ser: 0.7 mg/dL (ref 0.44–1.00)
GFR, Estimated: >60 mL/min (ref >60)
Glucose, Bld: 131 mg/dL — ABNORMAL HIGH (ref 70–99)
Potassium: 3.8 mmol/L (ref 3.5–5.1)
Sodium: 137 mmol/L (ref 135–145)
Total Bilirubin: 0.4 mg/dL (ref 0.3–1.2)
Total Protein: 7.7 g/dL (ref 6.5–8.1)

## 2022-02-23 LAB — URINALYSIS, ROUTINE W REFLEX MICROSCOPIC
Bilirubin Urine: NEGATIVE
Glucose, UA: NEGATIVE mg/dL
Ketones, ur: NEGATIVE mg/dL
Nitrite: NEGATIVE
Protein, ur: 30 mg/dL — AB
Specific Gravity, Urine: 1.02 (ref 1.005–1.030)
WBC, UA: >50 WBC/hpf — ABNORMAL HIGH (ref 0–5)
pH: 6 (ref 5.0–8.0)

## 2022-02-23 LAB — POC URINE PREG, ED: Preg Test, Ur: NEGATIVE

## 2022-02-23 LAB — RESP PANEL BY RT-PCR (RSV, FLU A&B, COVID)  RVPGX2
Influenza A by PCR: NEGATIVE
Influenza B by PCR: NEGATIVE
Resp Syncytial Virus by PCR: NEGATIVE
SARS Coronavirus 2 by RT PCR: NEGATIVE

## 2022-02-23 LAB — LIPASE, BLOOD: Lipase: 31 U/L (ref 11–51)

## 2022-02-23 MED ORDER — SODIUM CHLORIDE 0.9 % IV SOLN
2.0000 g | Freq: Once | INTRAVENOUS | Status: AC
  Administered 2022-02-23: 2 g via INTRAVENOUS
  Filled 2022-02-23: qty 20

## 2022-02-23 MED ORDER — MORPHINE SULFATE (PF) 2 MG/ML IV SOLN
2.0000 mg | INTRAVENOUS | Status: DC | PRN
  Administered 2022-02-23: 2 mg via INTRAVENOUS
  Filled 2022-02-23: qty 1

## 2022-02-23 MED ORDER — ACETAMINOPHEN 650 MG RE SUPP: 650.0000 mg | Freq: Four times a day (QID) | RECTAL | Status: DC | PRN

## 2022-02-23 MED ORDER — ONDANSETRON 4 MG PO TBDP: 4.0000 mg | ORAL_TABLET | Freq: Four times a day (QID) | ORAL | 0 refills | Status: DC | PRN

## 2022-02-23 MED ORDER — ACETAMINOPHEN 325 MG PO TABS
650.0000 mg | ORAL_TABLET | Freq: Four times a day (QID) | ORAL | Status: DC | PRN
  Administered 2022-02-24 – 2022-02-25 (×3): 650 mg via ORAL
  Filled 2022-02-23 (×3): qty 2

## 2022-02-23 MED ORDER — MORPHINE SULFATE (PF) 4 MG/ML IV SOLN
4.0000 mg | Freq: Once | INTRAVENOUS | Status: AC
  Administered 2022-02-23: 4 mg via INTRAVENOUS
  Filled 2022-02-23: qty 1

## 2022-02-23 MED ORDER — ONDANSETRON HCL 4 MG/2ML IJ SOLN
4.0000 mg | Freq: Once | INTRAMUSCULAR | Status: AC
  Administered 2022-02-23: 4 mg via INTRAVENOUS
  Filled 2022-02-23: qty 2

## 2022-02-23 MED ORDER — METHOCARBAMOL 500 MG PO TABS
500.0000 mg | ORAL_TABLET | Freq: Three times a day (TID) | ORAL | Status: DC | PRN
  Administered 2022-02-23 – 2022-02-25 (×5): 500 mg via ORAL
  Filled 2022-02-23 (×9): qty 1

## 2022-02-23 MED ORDER — ONDANSETRON HCL 4 MG/2ML IJ SOLN
4.0000 mg | Freq: Four times a day (QID) | INTRAMUSCULAR | Status: DC | PRN
  Administered 2022-02-24: 4 mg via INTRAVENOUS
  Filled 2022-02-23: qty 2

## 2022-02-23 MED ORDER — ENOXAPARIN SODIUM 40 MG/0.4ML IJ SOSY
40.0000 mg | PREFILLED_SYRINGE | INTRAMUSCULAR | Status: DC
  Administered 2022-02-24: 40 mg via SUBCUTANEOUS
  Filled 2022-02-23: qty 0.4

## 2022-02-23 MED ORDER — ALBUTEROL SULFATE (2.5 MG/3ML) 0.083% IN NEBU: 2.5000 mg | INHALATION_SOLUTION | RESPIRATORY_TRACT | Status: DC | PRN

## 2022-02-23 MED ORDER — DM-GUAIFENESIN ER 30-600 MG PO TB12: 1.0000 | ORAL_TABLET | Freq: Two times a day (BID) | ORAL | Status: DC | PRN

## 2022-02-23 MED ORDER — MAGNESIUM HYDROXIDE 400 MG/5ML PO SUSP
30.0000 mL | Freq: Every day | ORAL | Status: DC | PRN
  Administered 2022-02-24: 30 mL via ORAL
  Filled 2022-02-23: qty 30

## 2022-02-23 MED ORDER — SODIUM CHLORIDE 0.9 % IV SOLN: INTRAVENOUS | Status: DC

## 2022-02-23 MED ORDER — IOHEXOL 300 MG/ML  SOLN
100.0000 mL | Freq: Once | INTRAMUSCULAR | Status: AC | PRN
  Administered 2022-02-23: 100 mL via INTRAVENOUS

## 2022-02-23 MED ORDER — SODIUM CHLORIDE 0.9 % IV BOLUS (SEPSIS)
1000.0000 mL | Freq: Once | INTRAVENOUS | Status: AC
  Administered 2022-02-23: 1000 mL via INTRAVENOUS

## 2022-02-23 MED ORDER — TRAZODONE HCL 50 MG PO TABS: 25.0000 mg | ORAL_TABLET | Freq: Every evening | ORAL | Status: DC | PRN

## 2022-02-23 MED ORDER — ACETAMINOPHEN 500 MG PO TABS
1000.0000 mg | ORAL_TABLET | Freq: Once | ORAL | Status: AC
  Administered 2022-02-23: 1000 mg via ORAL
  Filled 2022-02-23: qty 2

## 2022-02-23 MED ORDER — SODIUM CHLORIDE 0.9 % IV SOLN
1.0000 g | INTRAVENOUS | Status: DC
  Administered 2022-02-24 – 2022-02-25 (×2): 1 g via INTRAVENOUS
  Filled 2022-02-23 (×2): qty 1
  Filled 2022-02-23: qty 10

## 2022-02-23 MED ORDER — OXYCODONE-ACETAMINOPHEN 5-325 MG PO TABS
1.0000 | ORAL_TABLET | ORAL | Status: DC | PRN
  Administered 2022-02-23 – 2022-02-25 (×9): 1 via ORAL
  Filled 2022-02-23 (×9): qty 1

## 2022-02-23 MED ORDER — ONDANSETRON HCL 4 MG PO TABS: 4.0000 mg | ORAL_TABLET | Freq: Four times a day (QID) | ORAL | Status: DC | PRN

## 2022-02-23 MED ORDER — NICOTINE 21 MG/24HR TD PT24
21.0000 mg | MEDICATED_PATCH | Freq: Every day | TRANSDERMAL | Status: DC
  Filled 2022-02-23: qty 1

## 2022-02-23 MED ORDER — ALBUTEROL SULFATE HFA 108 (90 BASE) MCG/ACT IN AERS: 2.0000 | INHALATION_SPRAY | RESPIRATORY_TRACT | Status: DC | PRN

## 2022-02-23 MED ORDER — SODIUM CHLORIDE 0.9 % IV BOLUS
1000.0000 mL | Freq: Once | INTRAVENOUS | Status: AC
  Administered 2022-02-23: 1000 mL via INTRAVENOUS

## 2022-02-23 MED ORDER — CYCLOBENZAPRINE HCL 10 MG PO TABS: 5.0000 mg | ORAL_TABLET | Freq: Three times a day (TID) | ORAL | Status: DC | PRN

## 2022-02-23 NOTE — ED Notes (Signed)
No meal tray delivered for pt, dietary called and meal tray requested

## 2022-02-23 NOTE — ED Notes (Signed)
Pt brought to hallway 5 at this time, this RN now assuming care.

## 2022-02-23 NOTE — ED Provider Notes (Signed)
[facility] Provider Note    Event Date/Time   First MD Initiated Contact with Patient 02/23/22 [phone]     (approximate)   History   Abdominal Pain   HPI  Paige Mitchell is a 26 y.o. female with history of asthma who presents to the emergency department with complaints of chills, diffuse bodyaches, right flank pain today.  Has had nausea without vomiting or diarrhea.  No dysuria, hematuria, vaginal bleeding or discharge, urinary frequency or urgency.  No cough, congestion, sore throat, ear pain.   History provided by patient.    Past Medical History:  Diagnosis Date   Asthma     Past Surgical History:  Procedure Laterality Date   COLONOSCOPY WITH ESOPHAGOGASTRODUODENOSCOPY (EGD)  2014   [facility]   INCISE AND DRAIN ABCESS     Right Groin- Positive MRSA per patient   PILONIDAL CYST DRAINAGE  01/2014   Dr. Juliann Pulse   PILONIDAL CYST DRAINAGE N/A 03/30/2016   Procedure: IRRIGATION AND DEBRIDEMENT PILONIDAL CYST. Prone position;  Surgeon: Ricarda Frame, MD;  Location: [facility] ORS;  Service: General;  Laterality: N/A;   PILONIDAL CYST DRAINAGE N/A 01/07/2021   Procedure: IRRIGATION AND DEBRIDEMENT PILONIDAL CYST;  Surgeon: Henrene Dodge, MD;  Location: [facility] ORS;  Service: General;  Laterality: N/A;  Prone position   PILONIDAL CYST EXCISION N/A 08/28/2021   Procedure: CYST EXCISION PILONIDAL EXTENSIVE;  Surgeon: Henrene Dodge, MD;  Location: [facility] ORS;  Service: General;  Laterality: N/A;  Provider requesting 1.5 hours / 90 minutes for procedure.    MEDICATIONS:  Prior to Admission medications   Medication Sig Start Date End Date Taking? Authorizing Provider  acetaminophen (TYLENOL) 500 MG tablet Take 2 tablets (1,000 mg total) by mouth every 6 (six) hours as needed for mild pain. 08/28/21   Henrene Dodge, MD  albuterol (PROVENTIL HFA;VENTOLIN HFA) 108 (90 Base) MCG/ACT inhaler Inhale 2 puffs into the lungs every 4 (four) hours as needed for wheezing or  shortness of breath. 11/22/16   Cuthriell, Delorise Royals, PA-C    Physical Exam   Triage Vital Signs: ED Triage Vitals [02/23/22 0124]  Enc Vitals Group     BP 135/85     Pulse Rate 97     Resp 16     Temp 99.5 F (37.5 C)     Temp Source Oral     SpO2 99 %     Weight 160 lb (72.6 kg)     Height 5\' 3"  (1.6 m)     Head Circumference      Peak Flow      Pain Score 7     Pain Loc      Pain Edu?      Excl. in GC?     Most recent vital signs: Vitals:   02/23/22 0446 02/23/22 0503  BP: (!) 96/51 108/84  Pulse: 80 77  Resp: 16 16  Temp:    SpO2: 99% 99%    CONSTITUTIONAL: Alert and oriented and responds appropriately to questions. Well-appearing; well-nourished HEAD: Normocephalic, atraumatic EYES: Conjunctivae clear, pupils appear equal, sclera nonicteric ENT: normal nose; moist mucous membranes NECK: Supple, normal ROM CARD: RRR; S1 and S2 appreciated; no murmurs, no clicks, no rubs, no gallops RESP: Normal chest excursion without splinting or tachypnea; breath sounds clear and equal bilaterally; no wheezes, no rhonchi, no rales, no hypoxia or respiratory distress, speaking full sentences ABD/GI: Normal bowel sounds; non-distended; soft, non-tender, no rebound, no guarding, no peritoneal signs BACK: The  back appears normal, patient has bilateral CVA tenderness EXT: Normal ROM in all joints; no deformity noted, no edema; no cyanosis SKIN: Normal color for age and race; warm; no rash on exposed skin NEURO: Moves all extremities equally, normal speech PSYCH: The patient's mood and manner are appropriate.   ED Results / Procedures / Treatments   LABS: (all labs ordered are listed, but only abnormal results are displayed) Labs Reviewed  COMPREHENSIVE METABOLIC PANEL - Abnormal; Notable for the following components:      Result Value   CO2 21 (*)    Glucose, Bld 131 (*)    All other components within normal limits  CBC - Abnormal; Notable for the following components:    WBC 12.0 (*)    Hemoglobin 11.5 (*)    HCT 35.6 (*)    All other components within normal limits  URINALYSIS, ROUTINE W REFLEX MICROSCOPIC - Abnormal; Notable for the following components:   Color, Urine YELLOW (*)    APPearance HAZY (*)    Hgb urine dipstick MODERATE (*)    Protein, ur 30 (*)    Leukocytes,Ua LARGE (*)    WBC, UA >50 (*)    Bacteria, UA RARE (*)    All other components within normal limits  RESP PANEL BY RT-PCR (RSV, FLU A&B, COVID)  RVPGX2  URINE CULTURE  LIPASE, BLOOD  POC URINE PREG, ED     EKG:  RADIOLOGY: My personal review and interpretation of imaging: CT shows left pyelonephritis.  I have personally reviewed all radiology reports.   CT ABDOMEN PELVIS W CONTRAST  Result Date: 02/23/2022 CLINICAL DATA:  Concern for pyelonephritis. EXAM: CT ABDOMEN AND PELVIS WITH CONTRAST TECHNIQUE: Multidetector CT imaging of the abdomen and pelvis was performed using the standard protocol following bolus administration of intravenous contrast. RADIATION DOSE REDUCTION: This exam was performed according to the departmental dose-optimization program which includes automated exposure control, adjustment of the mA and/or kV according to patient size and/or use of iterative reconstruction technique. CONTRAST:  OMNIPAQUE IOHEXOL 300 MG/ML  SOLN COMPARISON:  06/04/2011 FINDINGS: Lower chest: No acute abnormality. Hepatobiliary: No focal liver abnormality is seen. No gallstones, gallbladder wall thickening, or biliary dilatation. Pancreas: Unremarkable. No pancreatic ductal dilatation or surrounding inflammatory changes. Spleen: Normal in size without focal abnormality. Adrenals/Urinary Tract: Normal adrenal glands. Mild left-sided nephromegaly with striated nephrographic appearance concerning for pyelonephritis. No nephrolithiasis or hydronephrosis identified. No kidney mass. Urinary bladder is unremarkable. Stomach/Bowel: Stomach is within normal limits. Appendix appears normal.  No evidence of bowel wall thickening, distention, or inflammatory changes. Vascular/Lymphatic: No significant vascular findings are present. No enlarged abdominal or pelvic lymph nodes. Reproductive: Uterus is unremarkable. Corpus luteum cyst identified in the left ovary measuring 1.7 cm. Other: Small amount of free fluid noted within the cul-de-sac. No fluid collections. No signs of pneumoperitoneum. Musculoskeletal: No acute or significant osseous findings. IMPRESSION: 1. Mild left-sided nephromegaly with striated nephrographic appearance concerning for pyelonephritis. 2. Corpus luteum cyst in the left ovary with small amount of free fluid within the cul-de-sac, likely physiologic. Electronically Signed   By: Signa Kell M.D.   On: 02/23/2022 05:18     PROCEDURES:  Critical Care performed: No     Procedures    IMPRESSION / MDM / ASSESSMENT AND PLAN / ED COURSE  I reviewed the triage vital signs and the nursing notes.   Patient here with fevers, chills, body aches and right flank pain.      DIFFERENTIAL DIAGNOSIS (includes but  not limited to):   Viral illness, pneumonia, UTI, pyelonephritis, kidney stone   Patient's presentation is most consistent with acute presentation with potential threat to life or bodily function.   PLAN: Workup initiated from triage.  Patient has slight leukocytosis of 12,000.  Normal renal function, LFTs and lipase.  Urine does appear infected.  Will add on urine culture.  COVID, flu and RSV negative.  Given flank pain, will obtain CT scan to evaluate for infected stone, pyelonephritis.  Will give IV fluids, pain and nausea medicine, Rocephin.   MEDICATIONS GIVEN IN ED: Medications  morphine (PF) 4 MG/ML injection 4 mg (has no administration in time range)  ondansetron (ZOFRAN) injection 4 mg (has no administration in time range)  0.9 %  sodium chloride infusion (has no administration in time range)  sodium chloride 0.9 % bolus 1,000 mL (0 mLs  Intravenous Stopped 02/23/22 0526)  morphine (PF) 4 MG/ML injection 4 mg (4 mg Intravenous Given 02/23/22 0406)  ondansetron (ZOFRAN) injection 4 mg (4 mg Intravenous Given 02/23/22 0406)  acetaminophen (TYLENOL) tablet 1,000 mg (1,000 mg Oral Given 02/23/22 0405)  cefTRIAXone (ROCEPHIN) 2 g in sodium chloride 0.9 % 100 mL IVPB (0 g Intravenous Stopped 02/23/22 0441)  iohexol (OMNIPAQUE) 300 MG/ML solution 100 mL (100 mLs Intravenous Contrast Given 02/23/22 0431)     ED COURSE: CT scan reviewed and interpreted by myself and the radiologist and patient has left pyelonephritis.  She is able to tolerate p.o. but states she is feeling worse and does not feel comfortable to go home.  Will discuss with hospitalist for admission for observation for pyelonephritis for continued IV fluids and antibiotics.   CONSULTS:  Consulted and discussed patient's case with hospitalist, Dr. Arville Care.  I have recommended admission and consulting physician agrees and will place admission orders.  Patient (and family if present) agree with this plan.   I reviewed all nursing notes, vitals, pertinent previous records.  All labs, EKGs, imaging ordered have been independently reviewed and interpreted by myself.    OUTSIDE RECORDS REVIEWED: Reviewed patient's last surgery notes with Dr. Aleen Campi for pilonidal cyst that has been excised.       FINAL CLINICAL IMPRESSION(S) / ED DIAGNOSES   Final diagnoses:  Pyelonephritis     Rx / DC Orders   ED Discharge Orders          Ordered    ondansetron (ZOFRAN-ODT) 4 MG disintegrating tablet  Every 6 hours PRN        02/23/22 0535             Note:  This document was prepared using Dragon voice recognition software and may include unintentional dictation errors.   Taber Sweetser, Layla Maw, DO 02/23/22 [phone]

## 2022-02-23 NOTE — ED Notes (Signed)
Patient transported to CT 

## 2022-02-23 NOTE — Progress Notes (Deleted)
Patient discharged home with family.  Discharge instructions, when to follow up, and prescriptions reviewed with patient.  Patient verbalized understanding. Patient will be escorted out by auxiliary.   

## 2022-02-23 NOTE — ED Notes (Signed)
Pt given apple juice at this time.

## 2022-02-23 NOTE — ED Notes (Signed)
Meal tray delivered. Pt resting on stretcher at this time, [facility]CT[facility].

## 2022-02-23 NOTE — ED Triage Notes (Signed)
Pt to ER from home. Pt went to her PCP for back pain and was diagnosed with muscle strain. Now she is having abdominal pain along with chills. Pt advised it was intermittent. Pt is CAOx4 and ambulatory in triage.

## 2022-02-23 NOTE — ED Notes (Signed)
Pt describes her pain as more flank pain than abdominal pain and states "I think its my kidneys".

## 2022-02-23 NOTE — ED Notes (Signed)
ED Provider at bedside. 

## 2022-02-23 NOTE — ED Notes (Signed)
Pt is tolerating po but sts pain has worsened. MD notified.

## 2022-02-23 NOTE — H&P (Signed)
History and Physical    Paige Mitchell KWI:[id number] DOB: 1995/04/19 DOA: 02/23/2022  Referring MD/NP/PA:   PCP: [facility], [facility]   Patient coming from:  The patient is coming from home.  At baseline, pt is independent for most of ADL.        Chief Complaint: Left flank pain, increased urinary frequency  HPI: Paige Mitchell is a 26 y.o. female with medical history significant of asthma, vaping, who presents with left flank pain.  Patient states that she started having left flank pain yesterday.  The pain is constant, sharp, aching, radiating to the left side of abdomen. Associated with nausea and chills.  No vomiting or diarrhea.  No fever.  Patient has increased urinary frequency, no dysuria or burning on urination.  Denies chest pain, cough, shortness breath.  Data reviewed independently and ED Course: pt was found to have WBC 12.0, negative COVID PCR, positive urinalysis (hazy appearance, large amount of leukocyte, rare bacteria, WBC> 50), negative pregnancy test, GFR> 60.  Temperature normal, blood pressure 108/84, heart rate 97, RR 16, oxygen saturation 99% on room air.  CT abdomen/pelvis findings are consistent with left pyelonephritis.  Patient is placed on MedSurg bed for observation.   EKG: Reviewed independently, sinus rhythm, QTc 394, no ischemic change.   Review of Systems:   General: no fevers, has chills, no body weight gain, has poor appetite, has fatigue HEENT: no blurry vision, hearing changes or sore throat Respiratory: no dyspnea, coughing, wheezing CV: no chest pain, no palpitations GI: has nausea, no vomiting, abdominal pain, diarrhea, constipation GU: no dysuria, burning on urination, has increased urinary frequency, no hematuria  Ext: no leg edema Neuro: no unilateral weakness, numbness, or tingling, no vision change or hearing loss Skin: no rash, no skin tear. MSK: No muscle spasm, no deformity, no limitation of range of movement  in spin. Has left flank pain Heme: No easy bruising.  Travel history: No recent long distant travel.   Allergy:  Allergies  Allergen Reactions   Ibuprofen Hives and Swelling    Past Medical History:  Diagnosis Date   Asthma     Past Surgical History:  Procedure Laterality Date   COLONOSCOPY WITH ESOPHAGOGASTRODUODENOSCOPY (EGD)  2014   [facility]   INCISE AND DRAIN ABCESS     Right Groin- Positive MRSA per patient   PILONIDAL CYST DRAINAGE  01/2014   Dr. Juliann Pulse   PILONIDAL CYST DRAINAGE N/A 03/30/2016   Procedure: IRRIGATION AND DEBRIDEMENT PILONIDAL CYST. Prone position;  Surgeon: Ricarda Frame, MD;  Location: [facility] ORS;  Service: General;  Laterality: N/A;   PILONIDAL CYST DRAINAGE N/A 01/07/2021   Procedure: IRRIGATION AND DEBRIDEMENT PILONIDAL CYST;  Surgeon: Henrene Dodge, MD;  Location: [facility] ORS;  Service: General;  Laterality: N/A;  Prone position   PILONIDAL CYST EXCISION N/A 08/28/2021   Procedure: CYST EXCISION PILONIDAL EXTENSIVE;  Surgeon: Henrene Dodge, MD;  Location: [facility] ORS;  Service: General;  Laterality: N/A;  Provider requesting 1.5 hours / 90 minutes for procedure.    Social History:  reports that she has been smoking e-cigarettes and cigars. She has been exposed to tobacco smoke. She has never used smokeless tobacco. She reports current alcohol use. She reports that she does not currently use drugs after having used the following drugs: Marijuana.  Family History:  Family History  Problem Relation Age of Onset   Cancer Mother        cervical   Psoriasis Mother    Skin  cancer Mother    COPD Maternal Grandmother    Fibromyalgia Maternal Grandmother    Coronary artery disease Maternal Grandmother      Prior to Admission medications   Medication Sig Start Date End Date Taking? Authorizing Provider  acetaminophen (TYLENOL) 500 MG tablet Take 2 tablets (1,000 mg total) by mouth every 6 (six) hours as needed for mild pain. 08/28/21  Yes Piscoya, Elita QuickJose, MD   albuterol (PROVENTIL HFA;VENTOLIN HFA) 108 (90 Base) MCG/ACT inhaler Inhale 2 puffs into the lungs every 4 (four) hours as needed for wheezing or shortness of breath. 11/22/16  Yes Cuthriell, Delorise RoyalsJonathan D, PA-C  cyclobenzaprine (FLEXERIL) 5 MG tablet Take 5 mg by mouth 3 (three) times daily as needed. 02/04/22  Yes [provider]  ondansetron (ZOFRAN-ODT) 4 MG disintegrating tablet Take 1 tablet (4 mg total) by mouth every 6 (six) hours as needed for nausea or vomiting. 02/23/22  Yes Ward, Layla MawKristen N, DO    Physical Exam: Vitals:   02/23/22 0124 02/23/22 0446 02/23/22 0503 02/23/22 0601  BP: 135/85 (!) 96/51 108/84   Pulse: 97 80 77   Resp: 16 16 16    Temp: 99.5 F (37.5 C)   98.8 F (37.1 C)  TempSrc: Oral   Oral  SpO2: 99% 99% 99%   Weight: 72.6 kg     Height: 5\' 3"  (1.6 m)      General: Not in acute distress HEENT:       Eyes: PERRL, EOMI, no scleral icterus.       ENT: No discharge from the ears and nose, no pharynx injection, no tonsillar enlargement.        Neck: No JVD, no bruit, no mass felt. Heme: No neck lymph node enlargement. Cardiac: S1/S2, RRR, No murmurs, No gallops or rubs. Respiratory: No rales, wheezing, rhonchi or rubs. GI: Soft, nondistended, nontender, no rebound pain, no organomegaly, BS present. GU: has left CVA tendernss Ext: No pitting leg edema bilaterally. 1+DP/PT pulse bilaterally. Musculoskeletal: No joint deformities, No joint redness or warmth, no limitation of ROM in spin. Skin: No rashes.  Neuro: Alert, oriented X3, cranial nerves II-XII grossly intact, moves all extremities normally. Psych: Patient is not psychotic, no suicidal or hemocidal ideation.  Labs on Admission: I have personally reviewed following labs and imaging studies  CBC: Recent Labs  Lab 02/23/22 0127  WBC 12.0*  HGB 11.5*  HCT 35.6*  MCV 86.0  PLT 222   Basic Metabolic Panel: Recent Labs  Lab 02/23/22 0127  NA 137  K 3.8  CL 108  CO2 21*  GLUCOSE 131*   BUN 11  CREATININE 0.70  CALCIUM 9.4   GFR: Estimated Creatinine Clearance: 103.6 mL/min (by C-G formula based on SCr of 0.7 mg/dL). Liver Function Tests: Recent Labs  Lab 02/23/22 0127  AST 25  ALT 15  ALKPHOS 46  BILITOT 0.4  PROT 7.7  ALBUMIN 4.2   Recent Labs  Lab 02/23/22 0127  LIPASE 31   No results for input(s): "AMMONIA" in the last 168 hours. Coagulation Profile: No results for input(s): "INR", "PROTIME" in the last 168 hours. Cardiac Enzymes: No results for input(s): "CKTOTAL", "CKMB", "CKMBINDEX", "TROPONINI" in the last 168 hours. BNP (last 3 results) No results for input(s): "PROBNP" in the last 8760 hours. HbA1C: No results for input(s): "HGBA1C" in the last 72 hours. CBG: No results for input(s): "GLUCAP" in the last 168 hours. Lipid Profile: No results for input(s): "CHOL", "HDL", "LDLCALC", "TRIG", "CHOLHDL", "LDLDIRECT" in the last 72  hours. Thyroid Function Tests: No results for input(s): "TSH", "T4TOTAL", "FREET4", "T3FREE", "THYROIDAB" in the last 72 hours. Anemia Panel: No results for input(s): "VITAMINB12", "FOLATE", "FERRITIN", "TIBC", "IRON", "RETICCTPCT" in the last 72 hours. Urine analysis:    Component Value Date/Time   COLORURINE YELLOW (A) 02/23/2022 0126   APPEARANCEUR HAZY (A) 02/23/2022 0126   APPEARANCEUR Hazy 01/12/2012 0832   LABSPEC 1.020 02/23/2022 0126   LABSPEC 1.027 01/12/2012 0832   PHURINE 6.0 02/23/2022 0126   GLUCOSEU NEGATIVE 02/23/2022 0126   GLUCOSEU Negative 01/12/2012 0832   HGBUR MODERATE (A) 02/23/2022 0126   BILIRUBINUR NEGATIVE 02/23/2022 0126   BILIRUBINUR Negative 01/12/2012 0832   KETONESUR NEGATIVE 02/23/2022 0126   PROTEINUR 30 (A) 02/23/2022 0126   NITRITE NEGATIVE 02/23/2022 0126   LEUKOCYTESUR LARGE (A) 02/23/2022 0126   LEUKOCYTESUR Trace 01/12/2012 0832   Sepsis Labs: (procalcitonin:4,lacticidven:4) ) Recent Results (from the past 240 hour(s))  Resp panel by RT-PCR (RSV, Flu A&B,  Covid) Anterior Nasal Swab     Status: None   Collection Time: 02/23/22  1:27 AM   Specimen: Anterior Nasal Swab  Result Value Ref Range Status   SARS Coronavirus 2 by RT PCR NEGATIVE NEGATIVE Final    Comment: (NOTE) SARS-CoV-2 target nucleic acids are NOT DETECTED.  The SARS-CoV-2 RNA is generally detectable in upper respiratory specimens during the acute phase of infection. The lowest concentration of SARS-CoV-2 viral copies this assay can detect is 138 copies/mL. A negative result does not preclude SARS-Cov-2 infection and should not be used as the sole basis for treatment or other patient management decisions. A negative result may occur with  improper specimen collection/handling, submission of specimen other than nasopharyngeal swab, presence of viral mutation(s) within the areas targeted by this assay, and inadequate number of viral copies(<138 copies/mL). A negative result must be combined with clinical observations, patient history, and epidemiological information. The expected result is Negative.  Fact Sheet for Patients:  BloggerCourse.com  Fact Sheet for Healthcare Providers:  SeriousBroker.it  This test is no t yet approved or cleared by the [place] FDA and  has been authorized for detection and/or diagnosis of SARS-CoV-2 by FDA under an Emergency Use Authorization (EUA). This EUA will remain  in effect (meaning this test can be used) for the duration of the COVID-19 declaration under Section 564(b)(1) of the Act, 21 U.S.C.section 360bbb-3(b)(1), unless the authorization is terminated  or revoked sooner.       Influenza A by PCR NEGATIVE NEGATIVE Final   Influenza B by PCR NEGATIVE NEGATIVE Final    Comment: (NOTE) The Xpert Xpress SARS-CoV-2/FLU/RSV plus assay is intended as an aid in the diagnosis of influenza from Nasopharyngeal swab specimens and should not be used as a sole basis for treatment. Nasal  washings and aspirates are unacceptable for Xpert Xpress SARS-CoV-2/FLU/RSV testing.  Fact Sheet for Patients: BloggerCourse.com  Fact Sheet for Healthcare Providers: SeriousBroker.it  This test is not yet approved or cleared by the [place] FDA and has been authorized for detection and/or diagnosis of SARS-CoV-2 by FDA under an Emergency Use Authorization (EUA). This EUA will remain in effect (meaning this test can be used) for the duration of the COVID-19 declaration under Section 564(b)(1) of the Act, 21 U.S.C. section 360bbb-3(b)(1), unless the authorization is terminated or revoked.     Resp Syncytial Virus by PCR NEGATIVE NEGATIVE Final    Comment: (NOTE) Fact Sheet for Patients: BloggerCourse.com  Fact Sheet for Healthcare Providers: SeriousBroker.it  This test is not  yet approved or cleared by the [place] and has been authorized for detection and/or diagnosis of SARS-CoV-2 by FDA under an Emergency Use Authorization (EUA). This EUA will remain in effect (meaning this test can be used) for the duration of the COVID-19 declaration under Section 564(b)(1) of the Act, 21 U.S.C. section 360bbb-3(b)(1), unless the authorization is terminated or revoked.  Performed at [facility], [address], [place], [place] [address]      Radiological Exams on Admission: CT ABDOMEN PELVIS W CONTRAST  Result Date: 02/23/2022 CLINICAL DATA:  Concern for pyelonephritis. EXAM: CT ABDOMEN AND PELVIS WITH CONTRAST TECHNIQUE: Multidetector CT imaging of the abdomen and pelvis was performed using the standard protocol following bolus administration of intravenous contrast. RADIATION DOSE REDUCTION: This exam was performed according to the departmental dose-optimization program which includes automated exposure control, adjustment of the mA and/or kV according to  patient size and/or use of iterative reconstruction technique. CONTRAST:  OMNIPAQUE IOHEXOL 300 MG/ML  SOLN COMPARISON:  06/04/2011 FINDINGS: Lower chest: No acute abnormality. Hepatobiliary: No focal liver abnormality is seen. No gallstones, gallbladder wall thickening, or biliary dilatation. Pancreas: Unremarkable. No pancreatic ductal dilatation or surrounding inflammatory changes. Spleen: Normal in size without focal abnormality. Adrenals/Urinary Tract: Normal adrenal glands. Mild left-sided nephromegaly with striated nephrographic appearance concerning for pyelonephritis. No nephrolithiasis or hydronephrosis identified. No kidney mass. Urinary bladder is unremarkable. Stomach/Bowel: Stomach is within normal limits. Appendix appears normal. No evidence of bowel wall thickening, distention, or inflammatory changes. Vascular/Lymphatic: No significant vascular findings are present. No enlarged abdominal or pelvic lymph nodes. Reproductive: Uterus is unremarkable. Corpus luteum cyst identified in the left ovary measuring 1.7 cm. Other: Small amount of free fluid noted within the cul-de-sac. No fluid collections. No signs of pneumoperitoneum. Musculoskeletal: No acute or significant osseous findings. IMPRESSION: 1. Mild left-sided nephromegaly with striated nephrographic appearance concerning for pyelonephritis. 2. Corpus luteum cyst in the left ovary with small amount of free fluid within the cul-de-sac, likely physiologic. Electronically Signed   By: Signa Kell M.D.   On: 02/23/2022 05:18      Assessment/Plan Principal Problem:   Acute pyelonephritis Active Problems:   Asthma   Smoking   Overweight (BMI 25.0-29.9)   Assessment and Plan:  Acute pyelonephritis: Patient does not meets criteria for sepsis (WBC 12.0, heart rate 97, RR 16, no fever)  -  Place on med-surg bed for obs -  Ceftriaxone by IV - Follow up results of urine and blood cx and amend antibiotic regimen if needed per  sensitivity results - prn Zofran for nausea -As needed Percocet, morphine, Tylenol for pain - IVF: 2L of NS bolus  Asthma: stable. -As needed albuterol  Smoking:  pt do vaping -Nicotine patch. -Did counseling about importance of quitting vaping  Overweight (BMI 25.0-29.9): Body weight 72.6 kg, BMI 28.34 -Healthy diet and exercise -Encourage losing weight    DVT ppx:  SQ Lovenox  Code Status: Full code  Family Communication: not done, no family member is at bed side.   Disposition Plan:  Anticipate discharge back to previous environment  Consults called:  none  Admission status and Level of care: Med-Surg:    for obs    Dispo: The patient is from: Home              Anticipated d/c is to: Home              Anticipated d/c date is: 1 day  Patient currently is not medically stable to d/c.    Severity of Illness:  The appropriate patient status for this patient is OBSERVATION. Observation status is judged to be reasonable and necessary in order to provide the required intensity of service to ensure the patient's safety. The patient's presenting symptoms, physical exam findings, and initial radiographic and laboratory data in the context of their medical condition is felt to place them at decreased risk for further clinical deterioration. Furthermore, it is anticipated that the patient will be medically stable for discharge from the hospital within 2 midnights of admission.        Date of Service 02/23/2022    Lorretta Harp [facility]   If 7PM-7AM, please contact night-coverage [email or web address] 02/23/2022, 11:02 AM

## 2022-02-24 DIAGNOSIS — N10 Acute tubulo-interstitial nephritis: Secondary | ICD-10-CM | POA: Diagnosis not present

## 2022-02-24 LAB — CBC
HCT: 31.5 % — ABNORMAL LOW (ref 36.0–46.0)
Hemoglobin: 10.2 g/dL — ABNORMAL LOW (ref 12.0–15.0)
MCH: 27.7 pg (ref 26.0–34.0)
MCHC: 32.4 g/dL (ref 30.0–36.0)
MCV: 85.6 fL (ref 80.0–100.0)
Platelets: 178 10*3/uL (ref 150–400)
RBC: 3.68 MIL/uL — ABNORMAL LOW (ref 3.87–5.11)
RDW: 13.8 % (ref 11.5–15.5)
WBC: 7 10*3/uL (ref 4.0–10.5)
nRBC: 0 % (ref 0.0–0.2)

## 2022-02-24 LAB — BASIC METABOLIC PANEL
Anion gap: 4 — ABNORMAL LOW (ref 5–15)
BUN: 6 mg/dL (ref 6–20)
CO2: 23 mmol/L (ref 22–32)
Calcium: 8.4 mg/dL — ABNORMAL LOW (ref 8.9–10.3)
Chloride: 109 mmol/L (ref 98–111)
Creatinine, Ser: 0.73 mg/dL (ref 0.44–1.00)
GFR, Estimated: >60 mL/min (ref >60)
Glucose, Bld: 121 mg/dL — ABNORMAL HIGH (ref 70–99)
Potassium: 3.8 mmol/L (ref 3.5–5.1)
Sodium: 136 mmol/L (ref 135–145)

## 2022-02-24 LAB — HIV ANTIBODY (ROUTINE TESTING W REFLEX): HIV Screen 4th Generation wRfx: NONREACTIVE

## 2022-02-24 NOTE — Progress Notes (Signed)
  PROGRESS NOTE    Paige Mitchell  OVF:[id number] DOB: 1995/05/19 DOA: 02/23/2022 PCP: Pediatrics, [place]  [id number]  LOS: 0 days   Brief hospital course:   Assessment & Plan: Paige Mitchell is a 26 y.o. female with medical history significant of asthma, vaping, who presents with left flank pain.   Acute pyelonephritis:  --CT showed Mild left-sided nephromegaly with striated nephrographic appearance concerning for pyelonephritis.   --cont ceftriaxone pending urine cx --cont MIVF   Sepsis  --criteria met with tachycardia, leukocytosis on presentation.  Source, pyelo.  Asthma: stable. -As needed albuterol   Smoking:   pt does vaping -Nicotine patch. -Did counseling about importance of quitting vaping   Overweight (BMI 25.0-29.9): Body weight 72.6 kg, BMI 28.34 -Healthy diet and exercise -Encourage losing weight   DVT prophylaxis: Lovenox SQ Code Status: Full code  Family Communication:  Level of care: Med-Surg Dispo:   The patient is from: home Anticipated d/c is to: home Anticipated d/c date is: tomorrow   Subjective and Interval History:  Pt complained of pain that wrapped around her lower abdomen, also an area of point-tenderness over right mid back.   Objective: Vitals:   02/23/22 2309 02/24/22 0833 02/24/22 1059 02/24/22 1500  BP: 109/65 103/62 107/62 99/60  Pulse: 89 73 69 65  Resp: _0 Temp: 98.3 F (36.8 C) 98.7 F (37.1 C) 98.6 F (37 C) 98.8 F (37.1 C)  TempSrc: Oral Oral Oral Oral  SpO2: 100% 100% 99%   Weight:      Height:        Intake/Output Summary (Last 24 hours) at 02/24/2022 1730 Last data filed at 02/24/2022 1600 Gross per 24 hour  Intake 2744.83 ml  Output 2300 ml  Net 444.83 ml   Filed Weights   02/23/22 0124  Weight: 72.6 kg    Examination:   Constitutional: NAD, AAOx3 HEENT: conjunctivae and lids normal, EOMI CV: No cyanosis.   RESP: normal respiratory effort, on RA Neuro:  II - XII grossly intact.   Psych: Normal mood and affect.  Appropriate judgement and reason   Data Reviewed: I have personally reviewed labs and imaging studies  Time spent: 35 minutes  Enzo Bi, MD [facility] If 7PM-7AM, please contact night-coverage 02/24/2022, 5:30 PM

## 2022-02-25 DIAGNOSIS — N10 Acute tubulo-interstitial nephritis: Secondary | ICD-10-CM | POA: Diagnosis not present

## 2022-02-25 LAB — BASIC METABOLIC PANEL
Anion gap: 4 — ABNORMAL LOW (ref 5–15)
BUN: 6 mg/dL (ref 6–20)
CO2: 25 mmol/L (ref 22–32)
Calcium: 8.5 mg/dL — ABNORMAL LOW (ref 8.9–10.3)
Chloride: 109 mmol/L (ref 98–111)
Creatinine, Ser: 0.68 mg/dL (ref 0.44–1.00)
GFR, Estimated: >60 mL/min (ref >60)
Glucose, Bld: 86 mg/dL (ref 70–99)
Potassium: 4.1 mmol/L (ref 3.5–5.1)
Sodium: 138 mmol/L (ref 135–145)

## 2022-02-25 LAB — CBC
HCT: 34.4 % — ABNORMAL LOW (ref 36.0–46.0)
Hemoglobin: 11 g/dL — ABNORMAL LOW (ref 12.0–15.0)
MCH: 28.1 pg (ref 26.0–34.0)
MCHC: 32 g/dL (ref 30.0–36.0)
MCV: 87.8 fL (ref 80.0–100.0)
Platelets: 228 10*3/uL (ref 150–400)
RBC: 3.92 MIL/uL (ref 3.87–5.11)
RDW: 13.9 % (ref 11.5–15.5)
WBC: 6.6 10*3/uL (ref 4.0–10.5)
nRBC: 0 % (ref 0.0–0.2)

## 2022-02-25 LAB — URINE CULTURE: Culture: >=100000 — AB

## 2022-02-25 LAB — MAGNESIUM: Magnesium: 2.2 mg/dL (ref 1.7–2.4)

## 2022-02-25 MED ORDER — ACETAMINOPHEN-CODEINE 300-30 MG PO TABS: 1.0000 | ORAL_TABLET | Freq: Four times a day (QID) | ORAL | 0 refills | Status: AC | PRN

## 2022-02-25 MED ORDER — CIPROFLOXACIN HCL 500 MG PO TABS: 500.0000 mg | ORAL_TABLET | Freq: Two times a day (BID) | ORAL | 0 refills | Status: AC

## 2022-02-25 NOTE — Progress Notes (Signed)
Patient discharged home. Discharge instructions and prescriptions given and reviewed with patient. Patient verbalized understanding. Pt walked self out to meet significant other waiting in the car.

## 2022-02-25 NOTE — Discharge Summary (Signed)
Physician Discharge Summary   Paige Mitchell  female DOB: 06-17-1995  QIW:[id number]  PCP: [facility], [place] date: 02/23/2022 Discharge date: 02/25/2022  Admitted From: home Disposition:  home CODE STATUS: Full code  Discharge Instructions     Discharge instructions   Complete by: As directed    You have received 3 days of IV antibiotic for your kidney injection.  Please finish 4 more days of oral Cipro twice daily starting tomorrow 02/26/22.     Dr. Enzo Bi [facility] Course:  For full details, please see H&P, progress notes, consult notes and ancillary notes.  Briefly,  Paige Mitchell is a 26 y.o. female with medical history significant of asthma, vaping, who presented with left flank pain.    Acute pyelonephritis:  --CT showed Mild left-sided nephromegaly with striated nephrographic appearance concerning for pyelonephritis.   --urine cx pos for E coli --pt received 3 days of ceftriaxone and was discharged on 4 more days of Cipro.   Sepsis  --criteria met with tachycardia, leukocytosis on presentation.  Source, pyelo.   Asthma: stable. -As needed albuterol   Smoking:   pt does vaping -Nicotine patch. -admitting physician did counseling about importance of quitting vaping   Overweight (BMI 28):    Discharge Diagnoses:  Principal Problem:   Acute pyelonephritis Active Problems:   Asthma   Smoking   Overweight (BMI 25.0-29.9)     Discharge Instructions:  Allergies as of 02/25/2022       Reactions   Ibuprofen Hives, Swelling        Medication List     TAKE these medications    acetaminophen 500 MG tablet Commonly known as: TYLENOL Take 2 tablets (1,000 mg total) by mouth every 6 (six) hours as needed for mild pain.   acetaminophen-codeine 300-30 MG tablet Commonly known as: TYLENOL #3 Take 1 tablet by mouth every 6 (six) hours as needed for up to 3 days for moderate pain or severe pain.    albuterol 108 (90 Base) MCG/ACT inhaler Commonly known as: VENTOLIN HFA Inhale 2 puffs into the lungs every 4 (four) hours as needed for wheezing or shortness of breath.   ciprofloxacin 500 MG tablet Commonly known as: Cipro Take 1 tablet (500 mg total) by mouth 2 (two) times daily for 4 days. Start taking on: February 26, 2022   cyclobenzaprine 5 MG tablet Commonly known as: FLEXERIL Take 5 mg by mouth 3 (three) times daily as needed.   ondansetron 4 MG disintegrating tablet Commonly known as: ZOFRAN-ODT Take 1 tablet (4 mg total) by mouth every 6 (six) hours as needed for nausea or vomiting.         Follow-up Information     [facility] Schedule an appointment as soon as possible for a visit in 1 week(s).   Specialty: Family Medicine Contact information: [place] [address] [phone]                 Allergies  Allergen Reactions   Ibuprofen Hives and Swelling     The results of significant diagnostics from this hospitalization (including imaging, microbiology, ancillary and laboratory) are listed below for reference.   Consultations:   Procedures/Studies: CT ABDOMEN PELVIS W CONTRAST  Result Date: 02/23/2022 CLINICAL DATA:  Concern for pyelonephritis. EXAM: CT ABDOMEN AND PELVIS WITH CONTRAST TECHNIQUE: Multidetector CT imaging of the abdomen and pelvis was performed using the standard  protocol following bolus administration of intravenous contrast. RADIATION DOSE REDUCTION: This exam was performed according to the departmental dose-optimization program which includes automated exposure control, adjustment of the mA and/or kV according to patient size and/or use of iterative reconstruction technique. CONTRAST:  129m OMNIPAQUE IOHEXOL 300 MG/ML  SOLN COMPARISON:  06/04/2011 FINDINGS: Lower chest: No acute abnormality. Hepatobiliary: No focal liver abnormality is seen. No gallstones, gallbladder wall thickening, or  biliary dilatation. Pancreas: Unremarkable. No pancreatic ductal dilatation or surrounding inflammatory changes. Spleen: Normal in size without focal abnormality. Adrenals/Urinary Tract: Normal adrenal glands. Mild left-sided nephromegaly with striated nephrographic appearance concerning for pyelonephritis. No nephrolithiasis or hydronephrosis identified. No kidney mass. Urinary bladder is unremarkable. Stomach/Bowel: Stomach is within normal limits. Appendix appears normal. No evidence of bowel wall thickening, distention, or inflammatory changes. Vascular/Lymphatic: No significant vascular findings are present. No enlarged abdominal or pelvic lymph nodes. Reproductive: Uterus is unremarkable. Corpus luteum cyst identified in the left ovary measuring 1.7 cm. Other: Small amount of free fluid noted within the cul-de-sac. No fluid collections. No signs of pneumoperitoneum. Musculoskeletal: No acute or significant osseous findings. IMPRESSION: 1. Mild left-sided nephromegaly with striated nephrographic appearance concerning for pyelonephritis. 2. Corpus luteum cyst in the left ovary with small amount of free fluid within the cul-de-sac, likely physiologic. Electronically Signed   By: TKerby MoorsM.D.   On: 02/23/2022 05:18      Labs: BNP (last 3 results) No results for input(s): "BNP" in the last 8760 hours. Basic Metabolic Panel: Recent Labs  Lab 02/23/22 0127 02/24/22 0514 02/25/22 0543  NA 137 136 138  K 3.8 3.8 4.1  CL 108 109 109  CO2 21* 23 25  GLUCOSE 131* 121* 86  BUN _0 CREATININE 0.70 0.73 0.68  CALCIUM 9.4 8.4* 8.5*  MG  --   --  2.2   Liver Function Tests: Recent Labs  Lab 02/23/22 0127  AST 25  ALT 15  ALKPHOS 46  BILITOT 0.4  PROT 7.7  ALBUMIN 4.2   Recent Labs  Lab 02/23/22 0127  LIPASE 31   No results for input(s): "AMMONIA" in the last 168 hours. CBC: Recent Labs  Lab 02/23/22 0127 02/24/22 0514 02/25/22 0543  WBC 12.0* 7.0 6.6  HGB 11.5* 10.2*  11.0*  HCT 35.6* 31.5* 34.4*  MCV 86.0 85.6 87.8  PLT 222 178 228   Cardiac Enzymes: No results for input(s): "CKTOTAL", "CKMB", "CKMBINDEX", "TROPONINI" in the last 168 hours. BNP: Invalid input(s): "POCBNP" CBG: No results for input(s): "GLUCAP" in the last 168 hours. D-Dimer No results for input(s): "DDIMER" in the last 72 hours. Hgb A1c No results for input(s): "HGBA1C" in the last 72 hours. Lipid Profile No results for input(s): "CHOL", "HDL", "LDLCALC", "TRIG", "CHOLHDL", "LDLDIRECT" in the last 72 hours. Thyroid function studies No results for input(s): "TSH", "T4TOTAL", "T3FREE", "THYROIDAB" in the last 72 hours.  Invalid input(s): "FREET3" Anemia work up No results for input(s): "VITAMINB12", "FOLATE", "FERRITIN", "TIBC", "IRON", "RETICCTPCT" in the last 72 hours. Urinalysis    Component Value Date/Time   COLORURINE YELLOW (A) 02/23/2022 0126   APPEARANCEUR HAZY (A) 02/23/2022 0126   APPEARANCEUR Hazy 01/12/2012 0832   LABSPEC 1.020 02/23/2022 0126   LABSPEC 1.027 01/12/2012 0832   PHURINE 6.0 02/23/2022 0126   GLUCOSEU NEGATIVE 02/23/2022 0126   GLUCOSEU Negative 01/12/2012 0832   HGBUR MODERATE (A) 02/23/2022 0126   BILIRUBINUR NEGATIVE 02/23/2022 0126   BILIRUBINUR Negative 01/12/2012 [place]12/18/2023 0126  PROTEINUR 30 (A) 02/23/2022 0126   NITRITE NEGATIVE 02/23/2022 0126   LEUKOCYTESUR LARGE (A) 02/23/2022 0126   LEUKOCYTESUR Trace 01/12/2012 0832   Sepsis Labs Recent Labs  Lab 02/23/22 0127 02/24/22 0514 02/25/22 0543  WBC 12.0* 7.0 6.6   Microbiology Recent Results (from the past 240 hour(s))  Urine Culture     Status: Abnormal   Collection Time: 02/23/22  1:26 AM   Specimen: Urine, Clean Catch  Result Value Ref Range Status   Specimen Description   Final    URINE, CLEAN CATCH Performed at [facility], [address] [place], [place] [address]    Special Requests   Final    NONE Performed at [facility], [address] [place], [place] [address]    Culture >=100,000 COLONIES/mL ESCHERICHIA COLI (A)  Final   Report Status 02/25/2022 FINAL  Final   Organism ID, Bacteria ESCHERICHIA COLI (A)  Final      Susceptibility   Escherichia coli - MIC*    AMPICILLIN >=32 RESISTANT Resistant     CEFAZOLIN <=4 SENSITIVE Sensitive     CEFEPIME <=0.12 SENSITIVE Sensitive     CEFTRIAXONE <=0.25 SENSITIVE Sensitive     CIPROFLOXACIN <=0.25 SENSITIVE Sensitive     GENTAMICIN <=1 SENSITIVE Sensitive     IMIPENEM <=0.25 SENSITIVE Sensitive     NITROFURANTOIN <=16 SENSITIVE Sensitive     TRIMETH/SULFA <=20 SENSITIVE Sensitive     AMPICILLIN/SULBACTAM 16 INTERMEDIATE Intermediate     PIP/TAZO <=4 SENSITIVE Sensitive     * >=100,000 COLONIES/mL ESCHERICHIA COLI  Resp panel by RT-PCR (RSV, Flu A&B, Covid) Anterior Nasal Swab     Status: None   Collection Time: 02/23/22  1:27 AM   Specimen: Anterior Nasal Swab  Result Value Ref Range Status   SARS Coronavirus 2 by RT PCR NEGATIVE NEGATIVE Final    Comment: (NOTE) SARS-CoV-2 target nucleic acids are NOT DETECTED.  The SARS-CoV-2 RNA is generally detectable in upper respiratory specimens during the acute phase of infection. The lowest concentration of SARS-CoV-2 viral copies this assay can detect is 138 copies/mL. A negative result does not preclude SARS-Cov-2 infection and should not be used as the sole basis for treatment or other patient management decisions. A negative result may occur with  improper specimen collection/handling, submission of specimen other than nasopharyngeal swab, presence of viral mutation(s) within the areas targeted by this assay, and inadequate number of viral copies(<138 copies/mL). A negative result must be combined with clinical observations, patient history, and epidemiological information. The expected result is Negative.  Fact Sheet for Patients:  EntrepreneurPulse.com.au  Fact Sheet  for Healthcare Providers:  IncredibleEmployment.be  This test is no t yet approved or cleared by the [place] FDA and  has been authorized for detection and/or diagnosis of SARS-CoV-2 by FDA under an Emergency Use Authorization (EUA). This EUA will remain  in effect (meaning this test can be used) for the duration of the COVID-19 declaration under Section 564(b)(1) of the Act, 21 U.S.C.section 360bbb-3(b)(1), unless the authorization is terminated  or revoked sooner.       Influenza A by PCR NEGATIVE NEGATIVE Final   Influenza B by PCR NEGATIVE NEGATIVE Final    Comment: (NOTE) The Xpert Xpress SARS-CoV-2/FLU/RSV plus assay is intended as an aid in the diagnosis of influenza from Nasopharyngeal swab specimens and should not be used as a sole basis for treatment. Nasal washings and aspirates are unacceptable for Xpert Xpress SARS-CoV-2/FLU/RSV testing.  Fact Sheet for  Patients: EntrepreneurPulse.com.au  Fact Sheet for Healthcare Providers: IncredibleEmployment.be  This test is not yet approved or cleared by the [place] FDA and has been authorized for detection and/or diagnosis of SARS-CoV-2 by FDA under an Emergency Use Authorization (EUA). This EUA will remain in effect (meaning this test can be used) for the duration of the COVID-19 declaration under Section 564(b)(1) of the Act, 21 U.S.C. section 360bbb-3(b)(1), unless the authorization is terminated or revoked.     Resp Syncytial Virus by PCR NEGATIVE NEGATIVE Final    Comment: (NOTE) Fact Sheet for Patients: EntrepreneurPulse.com.au  Fact Sheet for Healthcare Providers: IncredibleEmployment.be  This test is not yet approved or cleared by the [place] FDA and has been authorized for detection and/or diagnosis of SARS-CoV-2 by FDA under an Emergency Use Authorization (EUA). This EUA will remain in effect (meaning  this test can be used) for the duration of the COVID-19 declaration under Section 564(b)(1) of the Act, 21 U.S.C. section 360bbb-3(b)(1), unless the authorization is terminated or revoked.  Performed at [facility], [address] [place], [place] [address]      Total time spend on discharging this patient, including the last patient exam, discussing the hospital stay, instructions for ongoing care as it relates to all pertinent caregivers, as well as preparing the medical discharge records, prescriptions, and/or referrals as applicable, is 30 minutes.    Enzo Bi, MD  [facility] 02/25/2022, 12:57 PM

## 2022-04-15 ENCOUNTER — Emergency Department
Admission: EM | Admit: 2022-04-15 | Discharge: 2022-04-15 | Disposition: A | Discharge status: Home / Self Care | Payer: Medicaid Other | Attending: Emergency Medicine | Admitting: Emergency Medicine

## 2022-04-15 ENCOUNTER — Encounter: Payer: Self-pay | Admitting: Emergency Medicine

## 2022-04-15 ENCOUNTER — Other Ambulatory Visit: Payer: Self-pay

## 2022-04-15 DIAGNOSIS — B9789 Other viral agents as the cause of diseases classified elsewhere: Secondary | ICD-10-CM | POA: Diagnosis not present

## 2022-04-15 DIAGNOSIS — J45909 Unspecified asthma, uncomplicated: Secondary | ICD-10-CM | POA: Insufficient documentation

## 2022-04-15 DIAGNOSIS — J069 Acute upper respiratory infection, unspecified: Secondary | ICD-10-CM | POA: Insufficient documentation

## 2022-04-15 DIAGNOSIS — Z20822 Contact with and (suspected) exposure to covid-19: Secondary | ICD-10-CM | POA: Insufficient documentation

## 2022-04-15 DIAGNOSIS — R519 Headache, unspecified: Secondary | ICD-10-CM | POA: Diagnosis present

## 2022-04-15 LAB — GROUP A STREP BY PCR: Group A Strep by PCR: NOT DETECTED

## 2022-04-15 LAB — RESP PANEL BY RT-PCR (RSV, FLU A&B, COVID)  RVPGX2
Influenza A by PCR: NEGATIVE
Influenza B by PCR: NEGATIVE
Resp Syncytial Virus by PCR: NEGATIVE
SARS Coronavirus 2 by RT PCR: NEGATIVE

## 2022-04-15 NOTE — ED Triage Notes (Signed)
Pt via POV from home. Pt c/o sore throat, headache, chills and generalized body ache. Unknown if patient actually has a fever. Pt states it is painful to speak. Symptoms started yesterday. Pt is A&Ox4 and NAD

## 2022-04-15 NOTE — ED Provider Notes (Signed)
   [facility] Provider Note    Event Date/Time   First MD Initiated Contact with Patient 04/15/22 [phone]     (approximate)   History   Sore Throat   HPI  Paige Mitchell is a 27 y.o. female   presents to the ED complaint of sore throat, headache, chills and bodyaches that started yesterday.  Patient is unaware of any known sick exposures.  Patient denies any vomiting or diarrhea.  Has a history of asthma.      Physical Exam   Triage Vital Signs: ED Triage Vitals  Enc Vitals Group     BP 04/15/22 0720 125/78     Pulse Rate 04/15/22 0720 86     Resp 04/15/22 0720 20     Temp 04/15/22 0720 98.6 F (37 C)     Temp Source 04/15/22 0720 Oral     SpO2 04/15/22 0720 100 %     Weight 04/15/22 0718 145 lb (65.8 kg)     Height 04/15/22 0718 5\' 3"  (1.6 m)     Head Circumference --      Peak Flow --      Pain Score 04/15/22 0718 8     Pain Loc --      Pain Edu? --      Excl. in [place]? --     Most recent vital signs: Vitals:   04/15/22 0720  BP: 125/78  Pulse: 86  Resp: 20  Temp: 98.6 F (37 C)  SpO2: 100%     General: Awake, no distress.  CV:  Good peripheral perfusion.  Resp:  Normal effort.  Abd:  No distention.  Other:  Posterior pharynx with minimal erythema, no exudate and uvula is midline.  Neck is supple without cervical lymphadenopathy.   ED Results / Procedures / Treatments   Labs (all labs ordered are listed, but only abnormal results are displayed) Labs Reviewed  RESP PANEL BY RT-PCR (RSV, FLU A&B, COVID)  [id number]  GROUP A STREP BY PCR      PROCEDURES:  Critical Care performed:   Procedures   MEDICATIONS ORDERED IN ED: Medications - No data to display   IMPRESSION / MDM / [place] / ED COURSE  I reviewed the triage vital signs and the nursing notes.   Differential diagnosis includes, but is not limited to, COVID, influenza, strep pharyngitis, RSV, viral URI.  27 year old female presents to the ED with  complaint of sore throat, headache, chills and bodyaches that began yesterday.  Patient subjectively states she has a fever last night that is not recorded in triage today.  Patient was made aware that her hip and respiratory panel were negative.  She will continue to take Tylenol or ibuprofen as needed for symptoms and follow-up with her PCP if any continued problems or concerns.      Patient's presentation is most consistent with acute complicated illness / injury requiring diagnostic workup.  FINAL CLINICAL IMPRESSION(S) / ED DIAGNOSES   Final diagnoses:  Viral URI     Rx / DC Orders   ED Discharge Orders     None        Note:  This document was prepared using Dragon voice recognition software and may include unintentional dictation errors.   Johnn Hai, PA-C 04/15/22 1191    Lavonia Drafts, MD 04/15/22 1318

## 2022-04-15 NOTE — ED Notes (Signed)
See triage note. Sore throat, chills, HA. Currently texting on phone.

## 2022-04-15 NOTE — Discharge Instructions (Signed)
Follow-up with your primary care if any continued problems. Tylenol or ibuprofen as needed for fever or sore throat.  Increase fluids to stay hydrated.

## 2022-07-08 ENCOUNTER — Emergency Department
Admission: EM | Admit: 2022-07-08 | Discharge: 2022-07-08 | Discharge status: Against Medical Advice | Payer: Medicaid Other | Attending: Emergency Medicine | Admitting: Emergency Medicine

## 2022-07-08 ENCOUNTER — Other Ambulatory Visit: Payer: Self-pay

## 2022-07-08 DIAGNOSIS — Z5321 Procedure and treatment not carried out due to patient leaving prior to being seen by health care provider: Secondary | ICD-10-CM | POA: Diagnosis not present

## 2022-07-08 DIAGNOSIS — R1031 Right lower quadrant pain: Secondary | ICD-10-CM | POA: Insufficient documentation

## 2022-07-08 DIAGNOSIS — R110 Nausea: Secondary | ICD-10-CM | POA: Insufficient documentation

## 2022-07-08 LAB — CBC WITH DIFFERENTIAL/PLATELET
Abs Immature Granulocytes: 0.02 10*3/uL (ref 0.00–0.07)
Basophils Absolute: 0.1 10*3/uL (ref 0.0–0.1)
Basophils Relative: 1 %
Eosinophils Absolute: 0.1 10*3/uL (ref 0.0–0.5)
Eosinophils Relative: 1 %
HCT: 35 % — ABNORMAL LOW (ref 36.0–46.0)
Hemoglobin: 11.3 g/dL — ABNORMAL LOW (ref 12.0–15.0)
Immature Granulocytes: 0 %
Lymphocytes Relative: 34 %
Lymphs Abs: 3.2 10*3/uL (ref 0.7–4.0)
MCH: 27.8 pg (ref 26.0–34.0)
MCHC: 32.3 g/dL (ref 30.0–36.0)
MCV: 86 fL (ref 80.0–100.0)
Monocytes Absolute: 0.6 10*3/uL (ref 0.1–1.0)
Monocytes Relative: 6 %
Neutro Abs: 5.5 10*3/uL (ref 1.7–7.7)
Neutrophils Relative %: 58 %
Platelets: 233 10*3/uL (ref 150–400)
RBC: 4.07 MIL/uL (ref 3.87–5.11)
RDW: 13.6 % (ref 11.5–15.5)
WBC: 9.4 10*3/uL (ref 4.0–10.5)
nRBC: 0 % (ref 0.0–0.2)

## 2022-07-08 LAB — COMPREHENSIVE METABOLIC PANEL
ALT: 10 U/L (ref 0–44)
AST: 17 U/L (ref 15–41)
Albumin: 4.3 g/dL (ref 3.5–5.0)
Alkaline Phosphatase: 40 U/L (ref 38–126)
Anion gap: 9 (ref 5–15)
BUN: 15 mg/dL (ref 6–20)
CO2: 23 mmol/L (ref 22–32)
Calcium: 9.2 mg/dL (ref 8.9–10.3)
Chloride: 105 mmol/L (ref 98–111)
Creatinine, Ser: 0.85 mg/dL (ref 0.44–1.00)
GFR, Estimated: >60 mL/min (ref >60)
Glucose, Bld: 102 mg/dL — ABNORMAL HIGH (ref 70–99)
Potassium: 3.8 mmol/L (ref 3.5–5.1)
Sodium: 137 mmol/L (ref 135–145)
Total Bilirubin: 0.5 mg/dL (ref 0.3–1.2)
Total Protein: 7.4 g/dL (ref 6.5–8.1)

## 2022-07-08 LAB — URINALYSIS, ROUTINE W REFLEX MICROSCOPIC
Bilirubin Urine: NEGATIVE
Glucose, UA: NEGATIVE mg/dL
Ketones, ur: NEGATIVE mg/dL
Nitrite: NEGATIVE
Protein, ur: 30 mg/dL — AB
Specific Gravity, Urine: 1.024 (ref 1.005–1.030)
WBC, UA: >50 WBC/hpf (ref 0–5)
pH: 6 (ref 5.0–8.0)

## 2022-07-08 LAB — POC URINE PREG, ED: Preg Test, Ur: NEGATIVE

## 2022-07-08 LAB — LIPASE, BLOOD: Lipase: 30 U/L (ref 11–51)

## 2022-07-08 NOTE — ED Triage Notes (Signed)
Patient ambulatory to triage with steady gait, without difficulty or distress noted; pt reports x 3 days having rt lower abd pain that radiates toward umbil area at times; pain increased today accomp by nausea

## 2022-10-29 ENCOUNTER — Ambulatory Visit
Admission: EM | Admit: 2022-10-29 | Discharge: 2022-10-29 | Disposition: A | Discharge status: Home / Self Care | Payer: Medicaid Other | Attending: Emergency Medicine | Admitting: Emergency Medicine

## 2022-10-29 DIAGNOSIS — Z3202 Encounter for pregnancy test, result negative: Secondary | ICD-10-CM | POA: Diagnosis not present

## 2022-10-29 LAB — POCT URINE PREGNANCY: Preg Test, Ur: NEGATIVE

## 2022-10-29 NOTE — ED Provider Notes (Signed)
Renaldo Fiddler    CSN: [id number] Arrival date & time: 10/29/22  1910      History   Chief Complaint Chief Complaint  Patient presents with   Possible Pregnancy    HPI Paige Mitchell is a 27 y.o. female.   Patient presents for evaluation for pregnancy test.  Endorses last menstruation 3 weeks ago, shorter and timeframe only lasting for 3 days when typically 5-7, flow lighter than normal.  Unsure of cause.  Has upcoming vacation and therefore took pregnancy test.  2 faint positive then 2 negative.  Expected menstruation in 7 days, uses period and ovulation tracker.  Begun to experience breast tenderness typically seen throughout the entirety of the breast but currently only experiencing in the nipple.  Has had increased discharge only after sexual encounters, no change in coloration or odor.  Past Medical History:  Diagnosis Date   Asthma     Patient Active Problem List   Diagnosis Date Noted   Acute pyelonephritis 02/23/2022   Asthma 02/23/2022   Overweight (BMI 25.0-29.9) 02/23/2022   Smoking 02/23/2022   Vapes nicotine containing substance 09/24/2021   Overweight BMI=27.7 09/24/2021   Pilonidal abscess     Past Surgical History:  Procedure Laterality Date   COLONOSCOPY WITH ESOPHAGOGASTRODUODENOSCOPY (EGD)  2014   [facility]   INCISE AND DRAIN ABCESS     Right Groin- Positive MRSA per patient   PILONIDAL CYST DRAINAGE  01/2014   Dr. Juliann Pulse   PILONIDAL CYST DRAINAGE N/A 03/30/2016   Procedure: IRRIGATION AND DEBRIDEMENT PILONIDAL CYST. Prone position;  Surgeon: Ricarda Frame, MD;  Location: [facility] ORS;  Service: General;  Laterality: N/A;   PILONIDAL CYST DRAINAGE N/A 01/07/2021   Procedure: IRRIGATION AND DEBRIDEMENT PILONIDAL CYST;  Surgeon: Henrene Dodge, MD;  Location: [facility] ORS;  Service: General;  Laterality: N/A;  Prone position   PILONIDAL CYST EXCISION N/A 08/28/2021   Procedure: CYST EXCISION PILONIDAL EXTENSIVE;  Surgeon: Henrene Dodge, MD;  Location:  [facility] ORS;  Service: General;  Laterality: N/A;  Provider requesting 1.5 hours / 90 minutes for procedure.    OB History     Gravida  0   Para  0   Term  0   Preterm  0   AB  0   Living  0      SAB  0   IAB  0   Ectopic  0   Multiple  0   Live Births  0            Home Medications    Prior to Admission medications   Medication Sig Start Date End Date Taking? Authorizing Provider  acetaminophen (TYLENOL) 500 MG tablet Take 2 tablets (1,000 mg total) by mouth every 6 (six) hours as needed for mild pain. 08/28/21   Henrene Dodge, MD  albuterol (PROVENTIL HFA;VENTOLIN HFA) 108 (90 Base) MCG/ACT inhaler Inhale 2 puffs into the lungs every 4 (four) hours as needed for wheezing or shortness of breath. 11/22/16   Cuthriell, Delorise [place]als, PA-C  cyclobenzaprine (FLEXERIL) 5 MG tablet Take 5 mg by mouth 3 (three) times daily as needed. 02/04/22   [provider]  ondansetron (ZOFRAN-ODT) 4 MG disintegrating tablet Take 1 tablet (4 mg total) by mouth every 6 (six) hours as needed for nausea or vomiting. 02/23/22   Ward, Layla Maw, DO    Family History Family History  Problem Relation Age of Onset   Cancer Mother        cervical  Psoriasis Mother    Skin cancer Mother    COPD Maternal Grandmother    Fibromyalgia Maternal Grandmother    Coronary artery disease Maternal Grandmother     Social History Social History   Tobacco Use   Smoking status: Every Day    Types: E-cigarettes, Cigars    Passive exposure: Past   Smokeless tobacco: Never  Vaping Use   Vaping status: Some Days   Substances: Nicotine  Substance Use Topics   Alcohol use: Yes    Comment: last use 07/2021   Drug use: Not Currently    Types: Marijuana    Comment: last use 2019     Allergies   Ibuprofen   Review of Systems Review of Systems   Physical Exam Triage Vital Signs ED Triage Vitals [10/29/22 1943]  Encounter Vitals Group     BP 110/77     Systolic BP Percentile       Diastolic BP Percentile      Pulse Rate 85     Resp 16     Temp 97.8 F (36.6 C)     Temp Source Oral     SpO2 100 %     Weight      Height      Head Circumference      Peak Flow      Pain Score      Pain Loc      Pain Education      Exclude from Growth Chart    No data found.  Updated Vital Signs BP 110/77 (BP Location: Left Arm)   Pulse 85   Temp 97.8 F (36.6 C) (Oral)   Resp 16   LMP 10/10/2022   SpO2 100%   Visual Acuity Right Eye Distance:   Left Eye Distance:   Bilateral Distance:    Right Eye Near:   Left Eye Near:    Bilateral Near:     Physical Exam Constitutional:      Appearance: Normal appearance.  Eyes:     Extraocular Movements: Extraocular movements intact.  Pulmonary:     Effort: Pulmonary effort is normal.  Genitourinary:    Comments: deferred Neurological:     Mental Status: She is alert and oriented to person, place, and time. Mental status is at baseline.      UC Treatments / Results  Labs (all labs ordered are listed, but only abnormal results are displayed) Labs Reviewed  POCT URINE PREGNANCY    EKG   Radiology No results found.  Procedures Procedures (including critical care time)  Medications Ordered in UC Medications - No data to display  Initial Impression / Assessment and Plan / UC Course  I have reviewed the triage vital signs and the nursing notes.  Pertinent labs & imaging results that were available during my care of the patient were reviewed by me and considered in my medical decision making (see chart for details).  Urine pregnancy test negative  Discussed findings with patient, at this time 3 weeks past last menstrual cycle, advised to monitor and if late or having abnormalities at the upcoming menstruation she is to take home pregnancy test, may also follow-up with this urgent care for reevaluation Final Clinical Impressions(s) / UC Diagnoses   Final diagnoses:  None   Discharge Instructions    None    ED Prescriptions   None    PDMP not reviewed this encounter.   Valinda Hoar, [place] 11/02/22 [phone]

## 2022-10-29 NOTE — ED Triage Notes (Signed)
Provider triage  

## 2022-11-04 ENCOUNTER — Other Ambulatory Visit: Payer: Self-pay

## 2022-11-04 ENCOUNTER — Emergency Department
Admission: EM | Admit: 2022-11-04 | Discharge: 2022-11-04 | Disposition: A | Discharge status: Home / Self Care | Payer: Medicaid Other | Attending: Emergency Medicine | Admitting: Emergency Medicine

## 2022-11-04 DIAGNOSIS — W57XXXA Bitten or stung by nonvenomous insect and other nonvenomous arthropods, initial encounter: Secondary | ICD-10-CM | POA: Diagnosis not present

## 2022-11-04 DIAGNOSIS — N912 Amenorrhea, unspecified: Secondary | ICD-10-CM | POA: Diagnosis not present

## 2022-11-04 DIAGNOSIS — S199XXA Unspecified injury of neck, initial encounter: Secondary | ICD-10-CM | POA: Diagnosis present

## 2022-11-04 DIAGNOSIS — S1086XA Insect bite of other specified part of neck, initial encounter: Secondary | ICD-10-CM | POA: Insufficient documentation

## 2022-11-04 LAB — HCG, QUANTITATIVE, PREGNANCY: hCG, Beta Chain, Quant, S: <1 m[IU]/mL (ref <5)

## 2022-11-04 MED ORDER — CEPHALEXIN 500 MG PO CAPS: 500.0000 mg | ORAL_CAPSULE | Freq: Three times a day (TID) | ORAL | 0 refills | Status: DC

## 2022-11-04 NOTE — Discharge Instructions (Signed)
Follow-up with your primary care provider or urgent care if any continued problems or concerns.  A prescription for antibiotics was sent to the pharmacy for you begin taking 3 times a day for the next 7 days.  The pregnancy test can be seen on MyChart and if it is negative you may begin taking Benadryl as needed for itching and also use Benadryl or cortisone cream to the area.  Until then you can use warm moist compresses to your neck and bug bite.  Tylenol as needed for pain.  The number for the [facility] department and also the OB/GYN clinic is listed on your discharge papers if you are pregnancy test is positive.

## 2022-11-04 NOTE — ED Provider Notes (Signed)
[facility] Provider Note    Event Date/Time   First MD Initiated Contact with Patient 11/04/22 [phone]     (approximate)   History   Insect Bite   HPI  Paige Mitchell is a 27 y.o. female   presents to the ED with complaint of an insect bite to the left side of her neck.  Patient is uncertain as to what bit her but reports that it does itch and is painful.  She also is requesting a blood test for pregnancy.  She states that she has had 2 negative and 2 positive urine test and was supposed to go to urgent care for a blood test.      Physical Exam   Triage Vital Signs: ED Triage Vitals  Encounter Vitals Group     BP 11/04/22 0742 116/79     Systolic BP Percentile --      Diastolic BP Percentile --      Pulse Rate 11/04/22 0742 69     Resp 11/04/22 0742 18     Temp 11/04/22 0742 97.9 F (36.6 C)     Temp Source 11/04/22 0742 Oral     SpO2 11/04/22 0742 100 %     Weight --      Height --      Head Circumference --      Peak Flow --      Pain Score 11/04/22 0741 10     Pain Loc --      Pain Education --      Exclude from Growth Chart --     Most recent vital signs: Vitals:   11/04/22 0742  BP: 116/79  Pulse: 69  Resp: 18  Temp: 97.9 F (36.6 C)  SpO2: 100%     General: Awake, no distress.  Able to talk in complete sentences without any difficulty. CV:  Good peripheral perfusion.  Resp:  Normal effort.  Abd:  No distention.  Other:  Left lateral neck with a single papule that appears to be an insect bite with some minimal erythema surrounding with a 2-1/2 cm diameter in total.  Area is tender to light palpation.  No drainage.   ED Results / Procedures / Treatments   Labs (all labs ordered are listed, but only abnormal results are displayed) Labs Reviewed  HCG, QUANTITATIVE, PREGNANCY       PROCEDURES:  Critical Care performed:   Procedures   MEDICATIONS ORDERED IN ED: Medications - No data to display   IMPRESSION  / MDM / ASSESSMENT AND PLAN / ED COURSE  I reviewed the triage vital signs and the nursing notes.   Differential diagnosis includes, but is not limited to, insect bite, spider bite, cellulitis, localized infection, amenorrhea, pregnancy.  27 year old female presents to the ED with complaint of an insect bite that occurred possibly while she was sleeping last night.  Patient complains of pain to the area.  Patient also is requesting a hCG pregnancy test as she has had 4 pregnancy test 2 that were positive to that was negative and was to follow-up with urgent care to have blood done today.  Patient was told that it is safe for her to take the Keflex and use warm moist compresses to the area on her neck.  She may take Tylenol as needed for pain.  She will be able to see the results of her blood test on MyChart and if it is negative then she may begin using Benadryl,  Benadryl cream or cortisone cream as needed for itching.  She has to follow-up with her PCP, urgent care and if she is pregnant the [facility] department or the OB/GYN listed on her discharge papers.      Patient's presentation is most consistent with acute complicated illness / injury requiring diagnostic workup.  FINAL CLINICAL IMPRESSION(S) / ED DIAGNOSES   Final diagnoses:  Bug bite with infection, initial encounter  Amenorrhea     Rx / DC Orders   ED Discharge Orders          Ordered    cephALEXin (KEFLEX) 500 MG capsule  3 times daily        11/04/22 5366             Note:  This document was prepared using Dragon voice recognition software and may include unintentional dictation errors.   Tommi Rumps, PA-C 11/04/22 4403    Jene Every, MD 11/04/22 [phone]

## 2022-11-04 NOTE — ED Triage Notes (Signed)
Pt to ED via Pov from home. Pt reports got bit by something while she was sleeping. Pt reports pain behind left ear and pain is now migrating.

## 2022-11-27 ENCOUNTER — Other Ambulatory Visit: Payer: Self-pay

## 2022-11-27 ENCOUNTER — Emergency Department: Payer: Medicaid Other

## 2022-11-27 ENCOUNTER — Emergency Department
Admission: EM | Admit: 2022-11-27 | Discharge: 2022-11-27 | Disposition: A | Discharge status: Home / Self Care | Payer: Medicaid Other | Attending: Emergency Medicine | Admitting: Emergency Medicine

## 2022-11-27 DIAGNOSIS — R1031 Right lower quadrant pain: Secondary | ICD-10-CM | POA: Diagnosis not present

## 2022-11-27 DIAGNOSIS — R197 Diarrhea, unspecified: Secondary | ICD-10-CM | POA: Diagnosis not present

## 2022-11-27 DIAGNOSIS — R109 Unspecified abdominal pain: Secondary | ICD-10-CM | POA: Diagnosis present

## 2022-11-27 DIAGNOSIS — K529 Noninfective gastroenteritis and colitis, unspecified: Secondary | ICD-10-CM

## 2022-11-27 DIAGNOSIS — R112 Nausea with vomiting, unspecified: Secondary | ICD-10-CM | POA: Diagnosis not present

## 2022-11-27 LAB — COMPREHENSIVE METABOLIC PANEL
ALT: 11 U/L (ref 0–44)
AST: 20 U/L (ref 15–41)
Albumin: 4.7 g/dL (ref 3.5–5.0)
Alkaline Phosphatase: 33 U/L — ABNORMAL LOW (ref 38–126)
Anion gap: 8 (ref 5–15)
BUN: 10 mg/dL (ref 6–20)
CO2: 21 mmol/L — ABNORMAL LOW (ref 22–32)
Calcium: 9.1 mg/dL (ref 8.9–10.3)
Chloride: 106 mmol/L (ref 98–111)
Creatinine, Ser: 0.75 mg/dL (ref 0.44–1.00)
GFR, Estimated: >60 mL/min (ref >60)
Glucose, Bld: 90 mg/dL (ref 70–99)
Potassium: 4.1 mmol/L (ref 3.5–5.1)
Sodium: 135 mmol/L (ref 135–145)
Total Bilirubin: 1.1 mg/dL (ref 0.3–1.2)
Total Protein: 8.1 g/dL (ref 6.5–8.1)

## 2022-11-27 LAB — URINALYSIS, ROUTINE W REFLEX MICROSCOPIC
Bilirubin Urine: NEGATIVE
Glucose, UA: NEGATIVE mg/dL
Hgb urine dipstick: NEGATIVE
Ketones, ur: NEGATIVE mg/dL
Leukocytes,Ua: NEGATIVE
Nitrite: NEGATIVE
Protein, ur: NEGATIVE mg/dL
Specific Gravity, Urine: 1.024 (ref 1.005–1.030)
pH: 7 (ref 5.0–8.0)

## 2022-11-27 LAB — CBC
HCT: 39.1 % (ref 36.0–46.0)
Hemoglobin: 12.8 g/dL (ref 12.0–15.0)
MCH: 28.3 pg (ref 26.0–34.0)
MCHC: 32.7 g/dL (ref 30.0–36.0)
MCV: 86.5 fL (ref 80.0–100.0)
Platelets: 229 10*3/uL (ref 150–400)
RBC: 4.52 MIL/uL (ref 3.87–5.11)
RDW: 12.9 % (ref 11.5–15.5)
WBC: 6.2 10*3/uL (ref 4.0–10.5)
nRBC: 0 % (ref 0.0–0.2)

## 2022-11-27 LAB — POC URINE PREG, ED: Preg Test, Ur: NEGATIVE

## 2022-11-27 LAB — LIPASE, BLOOD: Lipase: 28 U/L (ref 11–51)

## 2022-11-27 MED ORDER — IOHEXOL 300 MG/ML  SOLN
80.0000 mL | Freq: Once | INTRAMUSCULAR | Status: AC | PRN
  Administered 2022-11-27: 80 mL via INTRAVENOUS

## 2022-11-27 MED ORDER — SODIUM CHLORIDE 0.9 % IV BOLUS
1000.0000 mL | Freq: Once | INTRAVENOUS | Status: AC
  Administered 2022-11-27: 1000 mL via INTRAVENOUS

## 2022-11-27 MED ORDER — OXYCODONE-ACETAMINOPHEN 5-325 MG PO TABS
1.0000 | ORAL_TABLET | Freq: Once | ORAL | Status: AC
  Administered 2022-11-27: 1 via ORAL
  Filled 2022-11-27: qty 1

## 2022-11-27 MED ORDER — ONDANSETRON HCL 4 MG/2ML IJ SOLN
4.0000 mg | Freq: Once | INTRAMUSCULAR | Status: AC
  Administered 2022-11-27: 4 mg via INTRAVENOUS
  Filled 2022-11-27: qty 2

## 2022-11-27 NOTE — ED Provider Notes (Addendum)
[facility] Provider Note    Event Date/Time   First MD Initiated Contact with Patient 11/27/22 1237     (approximate)   History   Abdominal Pain   HPI  Paige Mitchell is a 27 y.o. female presents to the emergency department with right-sided abdominal pain.  Started having multiple episodes of diarrhea yesterday, 1 episode of nausea and vomiting.  Fever yesterday of 101.  Today went to urgent care and was told that she needed to come to the emergency department given her significant right sided abdominal pain.  States that it hurts significantly whenever they would press on her abdomen but it does not hurt when she is just laying down.  Denies any burning with urination.  Denies concern for pregnancy.  No prior abdominal surgery.  No falls or trauma.     Physical Exam   Triage Vital Signs: ED Triage Vitals  Encounter Vitals Group     BP 11/27/22 1203 107/78     Systolic BP Percentile --      Diastolic BP Percentile --      Pulse Rate 11/27/22 1203 72     Resp 11/27/22 1203 18     Temp 11/27/22 1203 98.6 F (37 C)     Temp Source 11/27/22 1203 Oral     SpO2 11/27/22 1203 100 %     Weight 11/27/22 1203 143 lb (64.9 kg)     Height 11/27/22 1203 5\' 3"  (1.6 m)     Head Circumference --      Peak Flow --      Pain Score 11/27/22 1200 7     Pain Loc --      Pain Education --      Exclude from Growth Chart --     Most recent vital signs: Vitals:   11/27/22 1203  BP: 107/78  Pulse: 72  Resp: 18  Temp: 98.6 F (37 C)  SpO2: 100%    Physical Exam Constitutional:      Appearance: She is well-developed.  HENT:     Head: Atraumatic.  Eyes:     Conjunctiva/sclera: Conjunctivae normal.  Cardiovascular:     Rate and Rhythm: Regular rhythm.  Pulmonary:     Effort: No respiratory distress.  Abdominal:     General: There is no distension.     Tenderness: There is abdominal tenderness in the right lower quadrant.  Musculoskeletal:         General: Normal range of motion.     Cervical back: Normal range of motion.  Skin:    General: Skin is warm.  Neurological:     Mental Status: She is alert. Mental status is at baseline.      IMPRESSION / MDM / ASSESSMENT AND PLAN / ED COURSE  I reviewed the triage vital signs and the nursing notes.  Differential diagnosis including acute appendicitis, acute cholecystitis, colitis, viral gastroenteritis  On chart review patient's COVID and influenza testing were negative   RADIOLOGY I independently reviewed imaging, my interpretation of imaging: CT abdomen and pelvis with contrast -no findings of bowel obstruction.  No obvious acute appendicitis on my evaluation.  No signs of ovarian masses or cyst.   Labs (all labs ordered are listed, but only abnormal results are displayed) Labs interpreted as -    Labs Reviewed  COMPREHENSIVE METABOLIC PANEL - Abnormal; Notable for the following components:      Result Value   CO2 21 (*)  Alkaline Phosphatase 33 (*)    All other components within normal limits  URINALYSIS, ROUTINE W REFLEX MICROSCOPIC - Abnormal; Notable for the following components:   Color, Urine YELLOW (*)    APPearance CLEAR (*)    All other components within normal limits  LIPASE, BLOOD  CBC  POC URINE PREG, ED   Pregnancy test is negative.  No leukocytosis.  No significant electrolyte abnormalities.  Lipase normal.  Normal LFTs.  Plan for CT abdomen and pelvis to further evaluate for acute appendicitis.  Given IV fluids and IV Zofran.  CT scan without acute findings.  Given Percocet for pain control.  No signs of severe right lower quadrant abdominal pain have a low suspicion for torsion.  Most likely with a viral gastroenteritis.  Has antiemetics at home.  Once a primary care physician to establish care.  Given return precautions for any worsening symptoms.      PROCEDURES:  Critical Care performed: No  Procedures  Patient's presentation is most  consistent with acute presentation with potential threat to life or bodily function.   MEDICATIONS ORDERED IN ED: Medications  oxyCODONE-acetaminophen (PERCOCET/ROXICET) 5-325 MG per tablet 1 tablet (has no administration in time range)  ondansetron (ZOFRAN) injection 4 mg (4 mg Intravenous Given 11/27/22 1414)  sodium chloride 0.9 % bolus 1,000 mL (1,000 mLs Intravenous New Bag/Given 11/27/22 1414)  iohexol (OMNIPAQUE) 300 MG/ML solution 80 mL (80 mLs Intravenous Contrast Given 11/27/22 1349)    FINAL CLINICAL IMPRESSION(S) / ED DIAGNOSES   Final diagnoses:  Right lower quadrant abdominal pain     Rx / DC Orders   ED Discharge Orders     None        Note:  This document was prepared using Dragon voice recognition software and may include unintentional dictation errors.   Corena Herter, MD 11/27/22 1456    Corena Herter, MD 11/27/22 1517

## 2022-11-27 NOTE — ED Notes (Signed)
See triage note  Presents from [facility] with right sided abd pain  Pt is afebrile   States pain started 1 day ago  Positive n/v  denies any urinary sxs'

## 2022-11-27 NOTE — Discharge Instructions (Addendum)
You were seen in the emergency department for right-sided abdominal pain.  Your lab work was normal.  Your COVID test was negative.  Your CT scan did not show any signs of appendicitis or findings of an infected gallbladder.  Stay hydrated and drink plenty of fluids.  Concern that you have a viral illness.  Return to the emergency department for any worsening symptoms.  You can take the nausea medication as needed for nausea and vomiting.  Pain control:  Acetaminophen (tylenol) - You can take 2 extra strength tablets (1000 mg) every 6 hours as needed for pain/fever.   Thank you for choosing [place] for your health care, it was my pleasure to care for you today!  Corena Herter, MD

## 2022-11-27 NOTE — ED Notes (Signed)
Pt comes with RLQ pain and tender to touch from [facility]. Pt states N/V. Pt states this all started 1day ago.

## 2023-04-05 ENCOUNTER — Other Ambulatory Visit: Payer: Self-pay

## 2023-04-05 ENCOUNTER — Encounter: Payer: Self-pay | Admitting: Emergency Medicine

## 2023-04-05 ENCOUNTER — Ambulatory Visit
Admission: EM | Admit: 2023-04-05 | Discharge: 2023-04-05 | Disposition: A | Discharge status: Home / Self Care | Payer: Medicaid Other | Attending: Emergency Medicine | Admitting: Emergency Medicine

## 2023-04-05 DIAGNOSIS — J069 Acute upper respiratory infection, unspecified: Secondary | ICD-10-CM | POA: Diagnosis not present

## 2023-04-05 LAB — POC COVID19/FLU A&B COMBO
Covid Antigen, POC: NEGATIVE
Influenza A Antigen, POC: NEGATIVE
Influenza B Antigen, POC: NEGATIVE

## 2023-04-05 LAB — POCT RAPID STREP A (OFFICE): Rapid Strep A Screen: NEGATIVE

## 2023-04-05 MED ORDER — CYCLOBENZAPRINE HCL 10 MG PO TABS: 10.0000 mg | ORAL_TABLET | Freq: Three times a day (TID) | ORAL | 0 refills | Status: DC | PRN

## 2023-04-05 MED ORDER — LIDOCAINE VISCOUS HCL 2 % MT SOLN: 15.0000 mL | OROMUCOSAL | 0 refills | Status: AC | PRN

## 2023-04-05 NOTE — ED Triage Notes (Signed)
Complains of sore throat, runny nose, cough, and both ears are sore and is getting a headache.   Symptoms for 2 days. Patient has not had no medications.

## 2023-04-05 NOTE — Discharge Instructions (Signed)
Your symptoms today are most likely being caused by a virus and should steadily improve in time it can take up to 7 to 10 days before you truly start to see a turnaround however things will get better  covid, Flu and strep test negative  May gargle and spit lidocaine solution as needed for temporary relief to your throat  May use muscle relaxant every 8 hours as needed for body aches    You can take Tylenol and/or Ibuprofen as needed for fever reduction and pain relief.   For cough: honey 1/2 to 1 teaspoon (you can dilute the honey in water or another fluid).  You can also use guaifenesin and dextromethorphan for cough. You can use a humidifier for chest congestion and cough.  If you don't have a humidifier, you can sit in the bathroom with the hot shower running.      For sore throat: try warm salt water gargles, cepacol lozenges, throat spray, warm tea or water with lemon/honey, popsicles or ice, or OTC cold relief medicine for throat discomfort.   For congestion: take a daily anti-histamine like Zyrtec, Claritin, and a oral decongestant, such as pseudoephedrine.  You can also use Flonase 1-2 sprays in each nostril daily.   It is important to stay hydrated: drink plenty of fluids (water, gatorade/powerade/pedialyte, juices, or teas) to keep your throat moisturized and help further relieve irritation/discomfort.

## 2023-04-06 NOTE — ED Provider Notes (Signed)
Renaldo Fiddler    CSN: [id number] Arrival date & time: 04/05/23  1742      History   Chief Complaint No chief complaint on file.   HPI Paige Mitchell is a 28 y.o. female.   Patient presents for evaluation-year-old chills, body aches, intermittent headaches, nasal congestion, a nonproductive cough, bilateral ear pain and increased fatigue and malaise present for 1 day.  Experiencing shortness of breath only when coughing, denies wheezing.  Known sick contacts.  Poor appetite but tolerating some food and liquids.  Has not attempted treatment.  Past Medical History:  Diagnosis Date   Asthma     Patient Active Problem List   Diagnosis Date Noted   Acute pyelonephritis 02/23/2022   Asthma 02/23/2022   Overweight (BMI 25.0-29.9) 02/23/2022   Smoking 02/23/2022   Vapes nicotine containing substance 09/24/2021   Overweight BMI=27.7 09/24/2021   Pilonidal abscess     Past Surgical History:  Procedure Laterality Date   COLONOSCOPY WITH ESOPHAGOGASTRODUODENOSCOPY (EGD)  2014   [facility]   INCISE AND DRAIN ABCESS     Right Groin- Positive MRSA per patient   PILONIDAL CYST DRAINAGE  01/2014   Dr. Juliann Pulse   PILONIDAL CYST DRAINAGE N/A 03/30/2016   Procedure: IRRIGATION AND DEBRIDEMENT PILONIDAL CYST. Prone position;  Surgeon: Ricarda Frame, MD;  Location: [facility] ORS;  Service: General;  Laterality: N/A;   PILONIDAL CYST DRAINAGE N/A 01/07/2021   Procedure: IRRIGATION AND DEBRIDEMENT PILONIDAL CYST;  Surgeon: Henrene Dodge, MD;  Location: [facility] ORS;  Service: General;  Laterality: N/A;  Prone position   PILONIDAL CYST EXCISION N/A 08/28/2021   Procedure: CYST EXCISION PILONIDAL EXTENSIVE;  Surgeon: Henrene Dodge, MD;  Location: [facility] ORS;  Service: General;  Laterality: N/A;  Provider requesting 1.5 hours / 90 minutes for procedure.    OB History     Gravida  0   Para  0   Term  0   Preterm  0   AB  0   Living  0      SAB  0   IAB  0   Ectopic  0   Multiple   0   Live Births  0            Home Medications    Prior to Admission medications   Medication Sig Start Date End Date Taking? Authorizing Provider  cyclobenzaprine (FLEXERIL) 10 MG tablet Take 1 tablet (10 mg total) by mouth 3 (three) times daily as needed for muscle spasms. 04/05/23  Yes Taleeya Blondin R, NP  lidocaine (XYLOCAINE) 2 % solution Use as directed 15 mLs in the mouth or throat as needed. 04/05/23  Yes Kieli Golladay, Elita Boone, NP  acetaminophen (TYLENOL) 500 MG tablet Take 2 tablets (1,000 mg total) by mouth every 6 (six) hours as needed for mild pain. 08/28/21   Henrene Dodge, MD  albuterol (PROVENTIL HFA;VENTOLIN HFA) 108 (90 Base) MCG/ACT inhaler Inhale 2 puffs into the lungs every 4 (four) hours as needed for wheezing or shortness of breath. 11/22/16   Cuthriell, Delorise Royals, PA-C  cephALEXin (KEFLEX) 500 MG capsule Take 1 capsule (500 mg total) by mouth 3 (three) times daily. Patient not taking: Reported on 04/05/2023 11/04/22   Tommi Rumps, PA-C    Family History Family History  Problem Relation Age of Onset   Cancer Mother        cervical   Psoriasis Mother    Skin cancer Mother    COPD Maternal Grandmother  Fibromyalgia Maternal Grandmother    Coronary artery disease Maternal Grandmother     Social History Social History   Tobacco Use   Smoking status: Every Day    Types: E-cigarettes, Cigars    Passive exposure: Past   Smokeless tobacco: Never  Vaping Use   Vaping status: Some Days   Substances: Nicotine  Substance Use Topics   Alcohol use: Yes    Comment: last use 07/2021   Drug use: Not Currently    Types: Marijuana    Comment: last use 2019     Allergies   Ibuprofen   Review of Systems Review of Systems   Physical Exam Triage Vital Signs ED Triage Vitals  Encounter Vitals Group     BP 04/05/23 1817 115/76     Systolic BP Percentile --      Diastolic BP Percentile --      Pulse Rate 04/05/23 1817 90     Resp 04/05/23 1817 16      Temp 04/05/23 1817 99.7 F (37.6 C)     Temp Source 04/05/23 1817 Oral     SpO2 04/05/23 1817 99 %     Weight --      Height --      Head Circumference --      Peak Flow --      Pain Score 04/05/23 1814 7     Pain Loc --      Pain Education --      Exclude from Growth Chart --    No data found.  Updated Vital Signs BP 115/76 (BP Location: Left Arm)   Pulse 90   Temp 99.7 F (37.6 C) (Oral)   Resp 16   LMP 03/23/2023 (Exact Date)   SpO2 99%   Visual Acuity Right Eye Distance:   Left Eye Distance:   Bilateral Distance:    Right Eye Near:   Left Eye Near:    Bilateral Near:     Physical Exam Constitutional:      Appearance: Normal appearance.  HENT:     Head: Normocephalic.     Right Ear: Tympanic membrane, ear canal and external ear normal.     Left Ear: Tympanic membrane, ear canal and external ear normal.     Nose: Congestion present. No rhinorrhea.     Mouth/Throat:     Pharynx: Posterior oropharyngeal erythema present. No oropharyngeal exudate.  Eyes:     Extraocular Movements: Extraocular movements intact.  Cardiovascular:     Rate and Rhythm: Normal rate and regular rhythm.     Pulses: Normal pulses.     Heart sounds: Normal heart sounds.  Pulmonary:     Effort: Pulmonary effort is normal.     Breath sounds: Normal breath sounds.  Musculoskeletal:     Cervical back: Normal range of motion and neck supple.  Neurological:     Mental Status: She is alert and oriented to person, place, and time. Mental status is at baseline.      UC Treatments / Results  Labs (all labs ordered are listed, but only abnormal results are displayed) Labs Reviewed  POCT RAPID STREP A (OFFICE) - Normal  POC COVID19/FLU A&B COMBO - Normal    EKG   Radiology No results found.  Procedures Procedures (including critical care time)  Medications Ordered in UC Medications - No data to display  Initial Impression / Assessment and Plan / UC Course  I have reviewed  the triage vital signs and the nursing notes.  Pertinent labs & imaging results that were available during my care of the patient were reviewed by me and considered in my medical decision making (see chart for details).  Viral URI with cough  Patient is in no signs of distress nor toxic appearing.  Vital signs are stable.  Low suspicion for pneumonia, pneumothorax or bronchitis and therefore will defer imaging.  COVID, flu and strep testing negative.  Prescribed viscous lidocaine and Flexeril as sore throat and bodyaches most worrisome symptom.May use additional over-the-counter medications as needed for supportive care.  May follow-up with urgent care as needed if symptoms persist or worsen.  Note given.   Final Clinical Impressions(s) / UC Diagnoses   Final diagnoses:  Viral URI with cough     Discharge Instructions      Your symptoms today are most likely being caused by a virus and should steadily improve in time it can take up to 7 to 10 days before you truly start to see a turnaround however things will get better  covid, Flu and strep test negative  May gargle and spit lidocaine solution as needed for temporary relief to your throat  May use muscle relaxant every 8 hours as needed for body aches    You can take Tylenol and/or Ibuprofen as needed for fever reduction and pain relief.   For cough: honey 1/2 to 1 teaspoon (you can dilute the honey in water or another fluid).  You can also use guaifenesin and dextromethorphan for cough. You can use a humidifier for chest congestion and cough.  If you don't have a humidifier, you can sit in the bathroom with the hot shower running.      For sore throat: try warm salt water gargles, cepacol lozenges, throat spray, warm tea or water with lemon/honey, popsicles or ice, or OTC cold relief medicine for throat discomfort.   For congestion: take a daily anti-histamine like Zyrtec, Claritin, and a oral decongestant, such as pseudoephedrine.   You can also use Flonase 1-2 sprays in each nostril daily.   It is important to stay hydrated: drink plenty of fluids (water, gatorade/powerade/pedialyte, juices, or teas) to keep your throat moisturized and help further relieve irritation/discomfort.    ED Prescriptions     Medication Sig Dispense Auth. Provider   lidocaine (XYLOCAINE) 2 % solution Use as directed 15 mLs in the mouth or throat as needed. 100 mL Nikola Marone R, NP   cyclobenzaprine (FLEXERIL) 10 MG tablet Take 1 tablet (10 mg total) by mouth 3 (three) times daily as needed for muscle spasms. 21 tablet Shelitha Magley, Elita Boone, NP      PDMP not reviewed this encounter.   Valinda Hoar, [place] 04/06/23 [phone]

## 2023-05-25 ENCOUNTER — Ambulatory Visit: Payer: Self-pay

## 2023-05-25 ENCOUNTER — Encounter: Payer: Self-pay | Admitting: Emergency Medicine

## 2023-05-25 ENCOUNTER — Other Ambulatory Visit: Payer: Self-pay

## 2023-05-25 ENCOUNTER — Emergency Department
Admission: EM | Admit: 2023-05-25 | Discharge: 2023-05-25 | Discharge status: Against Medical Advice | Attending: Emergency Medicine | Admitting: Emergency Medicine

## 2023-05-25 DIAGNOSIS — R110 Nausea: Secondary | ICD-10-CM | POA: Insufficient documentation

## 2023-05-25 DIAGNOSIS — R1031 Right lower quadrant pain: Secondary | ICD-10-CM | POA: Insufficient documentation

## 2023-05-25 DIAGNOSIS — R197 Diarrhea, unspecified: Secondary | ICD-10-CM | POA: Diagnosis not present

## 2023-05-25 DIAGNOSIS — Z5321 Procedure and treatment not carried out due to patient leaving prior to being seen by health care provider: Secondary | ICD-10-CM | POA: Insufficient documentation

## 2023-05-25 LAB — CBC
HCT: 37.3 % (ref 36.0–46.0)
Hemoglobin: 12.4 g/dL (ref 12.0–15.0)
MCH: 29.2 pg (ref 26.0–34.0)
MCHC: 33.2 g/dL (ref 30.0–36.0)
MCV: 87.8 fL (ref 80.0–100.0)
Platelets: 237 10*3/uL (ref 150–400)
RBC: 4.25 MIL/uL (ref 3.87–5.11)
RDW: 13.2 % (ref 11.5–15.5)
WBC: 5.9 10*3/uL (ref 4.0–10.5)
nRBC: 0 % (ref 0.0–0.2)

## 2023-05-25 LAB — COMPREHENSIVE METABOLIC PANEL
ALT: 11 U/L (ref 0–44)
AST: 18 U/L (ref 15–41)
Albumin: 4.2 g/dL (ref 3.5–5.0)
Alkaline Phosphatase: 26 U/L — ABNORMAL LOW (ref 38–126)
Anion gap: 6 (ref 5–15)
BUN: 15 mg/dL (ref 6–20)
CO2: 25 mmol/L (ref 22–32)
Calcium: 9.3 mg/dL (ref 8.9–10.3)
Chloride: 105 mmol/L (ref 98–111)
Creatinine, Ser: 0.74 mg/dL (ref 0.44–1.00)
GFR, Estimated: >60 mL/min (ref >60)
Glucose, Bld: 90 mg/dL (ref 70–99)
Potassium: 4.6 mmol/L (ref 3.5–5.1)
Sodium: 136 mmol/L (ref 135–145)
Total Bilirubin: 1 mg/dL (ref 0.0–1.2)
Total Protein: 7.3 g/dL (ref 6.5–8.1)

## 2023-05-25 LAB — URINALYSIS, ROUTINE W REFLEX MICROSCOPIC
Bilirubin Urine: NEGATIVE
Glucose, UA: NEGATIVE mg/dL
Ketones, ur: NEGATIVE mg/dL
Leukocytes,Ua: NEGATIVE
Nitrite: NEGATIVE
Protein, ur: NEGATIVE mg/dL
Specific Gravity, Urine: 1.026 (ref 1.005–1.030)
pH: 6 (ref 5.0–8.0)

## 2023-05-25 LAB — POC URINE PREG, ED: Preg Test, Ur: NEGATIVE

## 2023-05-25 LAB — LIPASE, BLOOD: Lipase: 26 U/L (ref 11–51)

## 2023-05-25 NOTE — ED Notes (Signed)
 First Nurse Note: Pt to ED via [facility]. Pt c/o RUQ abdominal pain. Pt is also c/o nausea but no vomiting or diarrhea.

## 2023-05-25 NOTE — ED Notes (Signed)
 Called patient to put in room and no answer. Will call again.

## 2023-05-25 NOTE — ED Triage Notes (Signed)
 Patient to ED via POV fro RLQ abd pain. Started yesterday with diarrhea, also having nausea.

## 2023-05-25 NOTE — Telephone Encounter (Signed)
 Chief Complaint: R sided pain Symptoms: R sided pain, coffee ground bowel movements, nausea Frequency: since this AM at 0600 Pertinent Negatives: Patient denies vomiting, fever, hematuria, UTI symptoms, CP, SOB Disposition: [] ED /[x] Urgent Care (no appt availability in office) / [] Appointment(In office/virtual)/ []  [facility]/ [] Home Care/ [] Refused Recommended Disposition /[] [facility]/ []  Follow-up with PCP Additional Notes: Pt reports 7/10 R sided pain that began at 0600 this AM. Pt states the pain is at the very end of her ribcage on her R side and radiates to her abdomen. Also endorses nausea. No vomiting. States her Bms have looked like coffee grounds and are dark/black. Hx of kidney infection and possible Crohns. No hematuria or UTI symptoms. Pt went to [facility] and was told to go to the ED at [facility] instead. Pt LWBS due to long wait time. Pt had labs drawn at [facility] and called to have those labs explained to her. RN educated pt about the labs. RN advised pt she should be seen by a HCP for moderate pain that has lasted since 0600 this AM with coffee ground looking Bms given her hx. Pt not wanting to return to the ED. RN advised pt go to a [facility] and gave her the address of the UC most close to her in [place]. Pt verbalized understanding.   Copied from CRM [id number]. Topic: Clinical - Red Word Triage >> May 25, 2023  3:54 PM Sim Boast F wrote: Red Word that prompted transfer to Nurse Triage: Patient called having pain in right side 8/10, went to [facility] but she left because she was waiting to long. Says her lab results are in her chart and she's concerned Reason for Disposition  [1] MILD-MODERATE pain AND [2] constant AND [3] present > 2 hours  Answer Assessment - Initial Assessment Questions 1. LOCATION: "Where does it hurt?"      R side at the bottom of her ribs that radiates to her stomach, smaller cramp in her lower R back but not as bad as under  her ribs and into her stomach 2. RADIATION: "Does the pain shoot anywhere else?" (e.g., chest, back)     Abdomen 3. ONSET: "When did the pain begin?" (e.g., minutes, hours or days ago)      0600 this AM 4. SUDDEN: "Gradual or sudden onset?"     Suddenly woke up this AM 5. PATTERN "Does the pain come and go, or is it constant?"    - If it comes and goes: "How long does it last?" "Do you have pain now?"     (Note: Comes and goes means the pain is intermittent. It goes away completely between bouts.)    - If constant: "Is it getting better, staying the same, or getting worse?"      (Note: Constant means the pain never goes away completely; most serious pain is constant and gets worse.)      Constant 6. SEVERITY: "How bad is the pain?"  (e.g., Scale 1-10; mild, moderate, or severe)    - MILD (1-3): Doesn't interfere with normal activities, abdomen soft and not tender to touch.     - MODERATE (4-7): Interferes with normal activities or awakens from sleep, abdomen tender to touch.     - SEVERE (8-10): Excruciating pain, doubled over, unable to do any normal activities.       7/10 7. RECURRENT SYMPTOM: "Have you ever had this type of stomach pain before?" If Yes, ask: "When was the last  time?" and "What happened that time?"      "I can't remember how long it was ago but I was hospitalized for a kidney infection and it felt similar this AM" 8. CAUSE: "What do you think is causing the stomach pain?"     Feels better laying on L side and "stretching" right side 9. RELIEVING/AGGRAVATING FACTORS: "What makes it better or worse?" (e.g., antacids, bending or twisting motion, bowel movement)     Lying on L side makes it better  10. OTHER SYMPTOMS: "Do you have any other symptoms?" (e.g., back pain, diarrhea, fever, urination pain, vomiting)       Nausea with drinking, diarrhea last night. No urinary symptoms. No fever. Pt took Tylenol today and brought pain to a 7. Pt states she had "coffee ground" -  looking BM today, history of colitis. Endorses very dark/black bowel movements. 11. PREGNANCY: "Is there any chance you are pregnant?" "When was your last menstrual period?"       Pregnancy test in ED was negative but pt states it is possible, not using protection  Protocols used: Abdominal Pain - [facility]

## 2023-11-29 ENCOUNTER — Emergency Department

## 2023-11-29 ENCOUNTER — Other Ambulatory Visit: Payer: Self-pay

## 2023-11-29 ENCOUNTER — Emergency Department
Admission: EM | Admit: 2023-11-29 | Discharge: 2023-11-29 | Disposition: A | Discharge status: Home / Self Care | Attending: Emergency Medicine | Admitting: Emergency Medicine

## 2023-11-29 DIAGNOSIS — W19XXXA Unspecified fall, initial encounter: Secondary | ICD-10-CM

## 2023-11-29 DIAGNOSIS — S060X0A Concussion without loss of consciousness, initial encounter: Secondary | ICD-10-CM | POA: Diagnosis not present

## 2023-11-29 DIAGNOSIS — W010XXA Fall on same level from slipping, tripping and stumbling without subsequent striking against object, initial encounter: Secondary | ICD-10-CM | POA: Diagnosis not present

## 2023-11-29 DIAGNOSIS — J45909 Unspecified asthma, uncomplicated: Secondary | ICD-10-CM | POA: Diagnosis not present

## 2023-11-29 DIAGNOSIS — S139XXA Sprain of joints and ligaments of unspecified parts of neck, initial encounter: Secondary | ICD-10-CM | POA: Insufficient documentation

## 2023-11-29 DIAGNOSIS — S0990XA Unspecified injury of head, initial encounter: Secondary | ICD-10-CM | POA: Diagnosis present

## 2023-11-29 LAB — POC URINE PREG, ED: Preg Test, Ur: NEGATIVE

## 2023-11-29 MED ORDER — DEXAMETHASONE SODIUM PHOSPHATE 10 MG/ML IJ SOLN
10.0000 mg | Freq: Once | INTRAMUSCULAR | Status: AC
  Administered 2023-11-29: 10 mg via INTRAVENOUS
  Filled 2023-11-29: qty 1

## 2023-11-29 MED ORDER — ONDANSETRON 4 MG PO TBDP
4.0000 mg | ORAL_TABLET | Freq: Once | ORAL | Status: AC
  Administered 2023-11-29: 4 mg via ORAL
  Filled 2023-11-29: qty 1

## 2023-11-29 MED ORDER — SODIUM CHLORIDE 0.9 % IV BOLUS
1000.0000 mL | Freq: Once | INTRAVENOUS | Status: AC
  Administered 2023-11-29: 1000 mL via INTRAVENOUS

## 2023-11-29 NOTE — ED Provider Notes (Signed)
 [facility] Provider Note    Event Date/Time   First MD Initiated Contact with Patient 11/29/23 1612     (approximate)   History   Fall   HPI  Paige Mitchell is a 28 y.o. female history of asthma presents emergency department after a fall 2 days ago.  Patient states she was at a football game [employer].  Tripped and fell down the bleachers.  The bleachers are concrete with metal seats.  States started to tumble and hit her mid back several times on the bleachers.  Hit her head several times also.  States she had a few bumps.  Is continue to have a headache, some blurred vision in and out, fatigue, dizziness.  Neck pain if she moves in certain directions will send a sharp stinging pain.  No loss of bowel or bladder control.  No abdominal injury.  Has not taken any medications today.      Physical Exam   Triage Vital Signs: ED Triage Vitals  Encounter Vitals Group     BP 11/29/23 1359 115/84     Girls Systolic BP Percentile --      Girls Diastolic BP Percentile --      Boys Systolic BP Percentile --      Boys Diastolic BP Percentile --      Pulse Rate 11/29/23 1359 78     Resp 11/29/23 1359 17     Temp 11/29/23 1359 98.5 F (36.9 C)     Temp src --      SpO2 11/29/23 1359 97 %     Weight 11/29/23 1358 140 lb (63.5 kg)     Height 11/29/23 1358 5' 3 (1.6 m)     Head Circumference --      Peak Flow --      Pain Score 11/29/23 1358 9     Pain Loc --      Pain Education --      Exclude from Growth Chart --     Most recent vital signs: Vitals:   11/29/23 1359 11/29/23 1809  BP: 115/84 120/80  Pulse: 78 80  Resp: 17 16  Temp: 98.5 F (36.9 C) 98 F (36.7 C)  SpO2: 97% 98%     General: Awake, no distress.   CV:  Good peripheral perfusion.  Resp:  Normal effort.  Abd:  No distention.   Other:  Cranial nerves II through XII grossly intact, patient is slow with finger-to-nose and does miss occasionally, PERRL, EOMI, C-spine  tender to palpation, T-spine tender to palpation around the bra line, grips are equal bilaterally, patient is able to stand slowly with help.  5 or 5 strength lower extremities.   ED Results / Procedures / Treatments   Labs (all labs ordered are listed, but only abnormal results are displayed) Labs Reviewed  POC URINE PREG, ED     EKG     RADIOLOGY CT head, CT cervical spine, x-ray T-spine    PROCEDURES:   Procedures  Critical Care:  no Chief Complaint  Patient presents with   Fall      MEDICATIONS ORDERED IN ED: Medications  sodium chloride  0.9 % bolus 1,000 mL (0 mLs Intravenous Stopped 11/29/23 1903)  dexamethasone  (DECADRON ) injection 10 mg (10 mg Intravenous Given 11/29/23 1711)  ondansetron  (ZOFRAN -ODT) disintegrating tablet 4 mg (4 mg Oral Given 11/29/23 1710)     IMPRESSION / MDM / ASSESSMENT AND PLAN / ED COURSE  I reviewed the  triage vital signs and the nursing notes.                              Differential diagnosis includes, but is not limited to, subdural, SAH, concussion, fracture, contusion, strain  Patient's presentation is most consistent with acute illness / injury with system symptoms.   Medications given: Decadron  10 mg IV, Zofran  4 mg IV, normal saline 1 L IV  CT of the head without contrast ordered from triage, this was independently reviewed interpreted by me as being negative for acute abnormality  Since patient is having sharp shooting pains coming out of the cervical spine and continues to have pain we will go ahead and do CT cervical spine due to mechanism of injury, x-ray of the T-spine due to tenderness along the mid back  For pain control patient was given Decadron  10 mg IV, Zofran  4 mg IV and normal saline 1 L IV.  She has a severe allergy to ibuprofen of which she has hives and swelling so will not do Toradol this time.   Patient is feeling better with fluids and Decadron .  CT of the head and cervical spine independently  reviewed interpreted by me as being negative for any acute abnormality, x-ray of the thoracic spine with independently reviewed interpreted by me as being negative for any acute abnormality  I did explain to the patient that she has a concussion.  Concussion restrictions discussed.  She was given 2 days off work.  However feel with the new data and research showing people are doing much better when they return to their normal activities earlier we will go ahead and let her go back to work.  However if she is worsening she should follow-up with the concussion clinic.  Return emergency department if needed.  She is in agreement treatment plan.  Do not see any red flags to warrant any further workup.  Discharged stable condition.   FINAL CLINICAL IMPRESSION(S) / ED DIAGNOSES   Final diagnoses:  Concussion without loss of consciousness, initial encounter  Fall, initial encounter  Neck sprain, initial encounter     Rx / DC Orders   ED Discharge Orders     None        Note:  This document was prepared using Dragon voice recognition software and may include unintentional dictation errors.    Gasper Devere ORN, PA-C 11/29/23 [name], MD 11/29/23 [phone]

## 2023-11-29 NOTE — ED Notes (Signed)
 See triage note  Presents s/p fall   States she fell on Saturday   States she was on the [employer] backwards   Having pain to neck and head

## 2023-11-29 NOTE — Discharge Instructions (Signed)
 Apply ice to areas that hurt.  Drink plenty of fluids.  Return to your normal activities in 2 days.

## 2023-11-29 NOTE — ED Triage Notes (Signed)
 Pt arrives via POV. Pt states she was at a football game this past Saturday. She states she tripped and fell down the bleachers. Pt c/o pain in head, mid back, and right side of her neck. Pt denies LOC when she fell. She arrives AxOx4. No cervical tenderness noted. PT states she has been drowsy and sensitive to light since the fall.

## 2023-12-06 ENCOUNTER — Ambulatory Visit
Admission: EM | Admit: 2023-12-06 | Discharge: 2023-12-06 | Disposition: A | Discharge status: Home / Self Care | Attending: Family Medicine | Admitting: Family Medicine

## 2023-12-06 ENCOUNTER — Encounter: Payer: Self-pay | Admitting: Emergency Medicine

## 2023-12-06 DIAGNOSIS — J4520 Mild intermittent asthma, uncomplicated: Secondary | ICD-10-CM | POA: Diagnosis present

## 2023-12-06 DIAGNOSIS — J069 Acute upper respiratory infection, unspecified: Secondary | ICD-10-CM | POA: Insufficient documentation

## 2023-12-06 LAB — GROUP A STREP BY PCR: Group A Strep by PCR: NOT DETECTED

## 2023-12-06 LAB — RESP PANEL BY RT-PCR (FLU A&B, COVID) ARPGX2
Influenza A by PCR: NEGATIVE
Influenza B by PCR: NEGATIVE
SARS Coronavirus 2 by RT PCR: NEGATIVE

## 2023-12-06 MED ORDER — IPRATROPIUM BROMIDE 0.06 % NA SOLN: 2.0000 | Freq: Four times a day (QID) | NASAL | 0 refills | Status: AC

## 2023-12-06 MED ORDER — PROMETHAZINE-DM 6.25-15 MG/5ML PO SYRP: 5.0000 mL | ORAL_SOLUTION | Freq: Four times a day (QID) | ORAL | 0 refills | Status: AC | PRN

## 2023-12-06 MED ORDER — ALBUTEROL SULFATE HFA 108 (90 BASE) MCG/ACT IN AERS: 2.0000 | INHALATION_SPRAY | RESPIRATORY_TRACT | 0 refills | Status: AC | PRN

## 2023-12-06 NOTE — ED Triage Notes (Signed)
 Pt presents with a productive cough, headache, sore throat, fever and runny nose x 2 days. Pt has taken OTC cold medication.

## 2023-12-06 NOTE — ED Provider Notes (Signed)
 [facility]    CSN: [id number] Arrival date & time: 12/06/23  0825      History   Chief Complaint Chief Complaint  Patient presents with   Nasal Congestion   Sore Throat   Cough    HPI Paige Mitchell is a 28 y.o. female.   HPI  History obtained from the patient. Paige Mitchell presents for 2 sore throat, nasal congestion, cough, fever, rhinorrhea, and nausea.  Tmax 102 F.   Denies vomiting, diarrhea and abdominal pain. She was sent home from work today due to her symptoms.  Took some  Tylenol , OTC cough and cold medications.    She has asthma and requests refill of her inhaler.     Past Medical History:  Diagnosis Date   Asthma     Patient Active Problem List   Diagnosis Date Noted   Acute pyelonephritis 02/23/2022   Asthma 02/23/2022   Overweight (BMI 25.0-29.9) 02/23/2022   Smoking 02/23/2022   Vapes nicotine  containing substance 09/24/2021   Overweight BMI=27.7 09/24/2021   Pilonidal abscess     Past Surgical History:  Procedure Laterality Date   COLONOSCOPY WITH ESOPHAGOGASTRODUODENOSCOPY (EGD)  2014   [facility]   INCISE AND DRAIN ABCESS     Right Groin- Positive MRSA per patient   PILONIDAL CYST DRAINAGE  01/2014   Dr. Ermelinda   PILONIDAL CYST DRAINAGE N/A 03/30/2016   Procedure: IRRIGATION AND DEBRIDEMENT PILONIDAL CYST. Prone position;  Surgeon: Carlin Pastel, MD;  Location: [facility] ORS;  Service: General;  Laterality: N/A;   PILONIDAL CYST DRAINAGE N/A 01/07/2021   Procedure: IRRIGATION AND DEBRIDEMENT PILONIDAL CYST;  Surgeon: Desiderio Schanz, MD;  Location: [facility] ORS;  Service: General;  Laterality: N/A;  Prone position   PILONIDAL CYST EXCISION N/A 08/28/2021   Procedure: CYST EXCISION PILONIDAL EXTENSIVE;  Surgeon: Desiderio Schanz, MD;  Location: [facility] ORS;  Service: General;  Laterality: N/A;  Provider requesting 1.5 hours / 90 minutes for procedure.    OB History     Gravida  0   Para  0   Term  0   Preterm  0   AB  0   Living  0       SAB  0   IAB  0   Ectopic  0   Multiple  0   Live Births  0            Home Medications    Prior to Admission medications   Medication Sig Start Date End Date Taking? Authorizing Provider  ipratropium (ATROVENT) 0.06 % nasal spray Place 2 sprays into both nostrils 4 (four) times daily. 12/06/23  Yes Jabir Dahlem, DO  promethazine -dextromethorphan (PROMETHAZINE -DM) 6.25-15 MG/5ML syrup Take 5 mLs by mouth 4 (four) times daily as needed. 12/06/23  Yes Luvia Orzechowski, DO  acetaminophen  (TYLENOL ) 500 MG tablet Take 2 tablets (1,000 mg total) by mouth every 6 (six) hours as needed for mild pain. 08/28/21   Desiderio Schanz, MD  albuterol  (VENTOLIN  HFA) 108 (90 Base) MCG/ACT inhaler Inhale 2 puffs into the lungs every 4 (four) hours as needed for wheezing or shortness of breath. 12/06/23   Farrell Pantaleo, DO  lidocaine  (XYLOCAINE ) 2 % solution Use as directed 15 mLs in the mouth or throat as needed. 04/05/23   Teresa Shelba SAUNDERS, NP    Family History Family History  Problem Relation Age of Onset   Cancer Mother        cervical   Psoriasis Mother    Skin cancer  Mother    COPD Maternal Grandmother    Fibromyalgia Maternal Grandmother    Coronary artery disease Maternal Grandmother     Social History Social History   Tobacco Use   Smoking status: Every Day    Types: E-cigarettes, Cigars    Passive exposure: Past   Smokeless tobacco: Never  Vaping Use   Vaping status: Some Days   Substances: Nicotine   Substance Use Topics   Alcohol use: Yes    Comment: last use 07/2021   Drug use: Not Currently    Types: Marijuana    Comment: last use 2019     Allergies   Ibuprofen   Review of Systems Review of Systems: negative unless otherwise stated in HPI.      Physical Exam Triage Vital Signs ED Triage Vitals  Encounter Vitals Group     BP 12/06/23 0904 108/77     Girls Systolic BP Percentile --      Girls Diastolic BP Percentile --      Boys Systolic BP  Percentile --      Boys Diastolic BP Percentile --      Pulse Rate 12/06/23 0904 78     Resp 12/06/23 0904 18     Temp 12/06/23 0904 98 F (36.7 C)     Temp Source 12/06/23 0904 Oral     SpO2 12/06/23 0904 100 %     Weight 12/06/23 0902 142 lb (64.4 kg)     Height --      Head Circumference --      Peak Flow --      Pain Score 12/06/23 0903 7     Pain Loc --      Pain Education --      Exclude from Growth Chart --    No data found.  Updated Vital Signs BP 108/77 (BP Location: Left Arm)   Pulse 78   Temp 98 F (36.7 C) (Oral)   Resp 18   Wt 64.4 kg   LMP 11/15/2023   SpO2 100%   BMI 25.15 kg/m   Visual Acuity Right Eye Distance:   Left Eye Distance:   Bilateral Distance:    Right Eye Near:   Left Eye Near:    Bilateral Near:     Physical Exam GEN:     alert, non-toxic appearing female in no distress    HENT:  mucus membranes moist, oropharyngeal without lesions, mild erythema, no tonsillar hypertrophy or exudates,  moderate erythematous edematous turbinates, clear nasal discharge, bilateral TM normal EYES:   pupils equal and reactive, no scleral injection or discharge NECK:  normal ROM, no meningismus   RESP:  no increased work of breathing, clear to auscultation bilaterally CVS:   regular rate and rhythm Skin:   warm and dry, no rash on visible skin    UC Treatments / Results  Labs (all labs ordered are listed, but only abnormal results are displayed) Labs Reviewed  RESP PANEL BY RT-PCR (FLU A&B, COVID) [id number]  GROUP A STREP BY PCR    EKG   Radiology No results found.   Procedures Procedures (including critical care time)  Medications Ordered in UC Medications - No data to display  Initial Impression / Assessment and Plan / UC Course  I have reviewed the triage vital signs and the nursing notes.  Pertinent labs & imaging results that were available during my care of the patient were reviewed by me and considered in my medical decision making  (see chart  for details).       Pt is a 28 y.o. female who has asthma presents for 2 days of respiratory symptoms. Paige Mitchell is afebrile here with recent antipyretics. Satting well on room air. Overall pt is non-toxic appearing, well hydrated, without respiratory distress. Pulmonary exam is unremarkable.  COVID and influenza panel obtained and was negative. Strep PCR is negative. Suspect viral respiratory illness. Discussed symptomatic treatment.  Explained lack of efficacy of antibiotics in viral disease.  Promethazine  DM for cough.  Atrovent nasal spray for nasal congestion.  Refilled albuterol  inhaler at patient's request.  Typical duration of symptoms discussed.  Work note provided.  Return and ED precautions given and voiced understanding. Discussed MDM, treatment plan and plan for follow-up with patient who agrees with plan.     Final Clinical Impressions(s) / UC Diagnoses   Final diagnoses:  Viral URI with cough  Mild intermittent asthma without complication     Discharge Instructions      Your strep, influenza and COVID are negative. You have a viral respiratory infection that will gradually improve over the next 7-10 days. Cough may last up to 3 weeks.   You can take Tylenol  and/or Ibuprofen as needed for fever reduction and pain relief.    For cough: honey 1/2 to 1 teaspoon (you can dilute the honey in water or another fluid).  Stop at the pharmacy to pick up your prescription cough medication. You can use a humidifier for chest congestion and cough.  If you don't have a humidifier, you can sit in the bathroom with the hot shower running.      For sore throat: try warm salt water gargles, Mucinex  sore throat cough drops or cepacol lozenges, throat spray, warm tea or water with lemon/honey, popsicles or ice, or OTC cold relief medicine for throat discomfort. You can also purchase chloraseptic spray at the pharmacy or dollar store.    For congestion: take a daily anti-histamine like  Zyrtec, Claritin, and a oral decongestant, such as pseudoephedrine. Pick up the prescription nasal spray from the pharmacy.    It is important to stay hydrated: drink plenty of fluids (water, gatorade/powerade/pedialyte, juices, or teas) to keep your throat moisturized and help further relieve irritation/discomfort.    Return or go to the Emergency Department if symptoms worsen or do not improve in the next few days      ED Prescriptions     Medication Sig Dispense Auth. Provider   albuterol  (VENTOLIN  HFA) 108 (90 Base) MCG/ACT inhaler Inhale 2 puffs into the lungs every 4 (four) hours as needed for wheezing or shortness of breath. 1 each Clementine Soulliere, DO   promethazine -dextromethorphan (PROMETHAZINE -DM) 6.25-15 MG/5ML syrup Take 5 mLs by mouth 4 (four) times daily as needed. 118 mL Jona Erkkila, DO   ipratropium (ATROVENT) 0.06 % nasal spray Place 2 sprays into both nostrils 4 (four) times daily. 15 mL Takara Sermons, DO      PDMP not reviewed this encounter.   Willian Donson, DO 12/06/23 1020

## 2023-12-06 NOTE — Discharge Instructions (Signed)
 Your strep, influenza and COVID are negative. You have a viral respiratory infection that will gradually improve over the next 7-10 days. Cough may last up to 3 weeks.   You can take Tylenol  and/or Ibuprofen as needed for fever reduction and pain relief.    For cough: honey 1/2 to 1 teaspoon (you can dilute the honey in water or another fluid).  Stop at the pharmacy to pick up your prescription cough medication. You can use a humidifier for chest congestion and cough.  If you don't have a humidifier, you can sit in the bathroom with the hot shower running.      For sore throat: try warm salt water gargles, Mucinex  sore throat cough drops or cepacol lozenges, throat spray, warm tea or water with lemon/honey, popsicles or ice, or OTC cold relief medicine for throat discomfort. You can also purchase chloraseptic spray at the pharmacy or dollar store.    For congestion: take a daily anti-histamine like Zyrtec, Claritin, and a oral decongestant, such as pseudoephedrine. Pick up the prescription nasal spray from the pharmacy.    It is important to stay hydrated: drink plenty of fluids (water, gatorade/powerade/pedialyte, juices, or teas) to keep your throat moisturized and help further relieve irritation/discomfort.    Return or go to the Emergency Department if symptoms worsen or do not improve in the next few days

## 2024-02-20 ENCOUNTER — Ambulatory Visit
Admission: EM | Admit: 2024-02-20 | Discharge: 2024-02-20 | Disposition: A | Discharge status: Home / Self Care | Attending: Emergency Medicine | Admitting: Emergency Medicine

## 2024-02-20 ENCOUNTER — Other Ambulatory Visit: Payer: Self-pay

## 2024-02-20 DIAGNOSIS — J101 Influenza due to other identified influenza virus with other respiratory manifestations: Secondary | ICD-10-CM

## 2024-02-20 DIAGNOSIS — M545 Low back pain, unspecified: Secondary | ICD-10-CM

## 2024-02-20 LAB — POCT URINE DIPSTICK
Glucose, UA: NEGATIVE mg/dL
Nitrite, UA: NEGATIVE
Protein Ur, POC: 100 mg/dL — AB
Spec Grav, UA: >=1.03 — AB (ref 1.010–1.025)
Urobilinogen, UA: 1 U/dL
pH, UA: 6 (ref 5.0–8.0)

## 2024-02-20 LAB — POCT URINE PREGNANCY: Preg Test, Ur: NEGATIVE

## 2024-02-20 LAB — POC COVID19/FLU A&B COMBO
Covid Antigen, POC: NEGATIVE
Influenza A Antigen, POC: POSITIVE — AB
Influenza B Antigen, POC: NEGATIVE

## 2024-02-20 MED ORDER — OSELTAMIVIR PHOSPHATE 75 MG PO CAPS: 75.0000 mg | ORAL_CAPSULE | Freq: Two times a day (BID) | ORAL | 0 refills | Status: AC

## 2024-02-20 NOTE — ED Provider Notes (Signed)
 [facility]    CSN: [id number] Arrival date & time: 02/20/24  1359      History   Chief Complaint Chief Complaint  Patient presents with   Generalized Body Aches   Cough   Fever   Sore Throat    HPI Paige Mitchell is a 28 y.o. female.  Patient presents with 2-day history of subjective fever, chills, body aches, sore throat, congestion, cough, back pain.  She has been treating her symptoms with Tylenol .  No chest pain, shortness of breath, vomiting, diarrhea.  Patient states she would like to have her urine checked because she is having back pain and has history of pyelonephritis in 2023.  She denies dysuria or flank pain.  Patient also reports history of asthma but states she has not had any wheezing or need for her rescue inhaler.  The history is provided by the patient and medical records.    Past Medical History:  Diagnosis Date   Asthma     Patient Active Problem List   Diagnosis Date Noted   Acute pyelonephritis 02/23/2022   Asthma 02/23/2022   Overweight (BMI 25.0-29.9) 02/23/2022   Smoking 02/23/2022   Vapes nicotine  containing substance 09/24/2021   Overweight BMI=27.7 09/24/2021   Pilonidal abscess     Past Surgical History:  Procedure Laterality Date   COLONOSCOPY WITH ESOPHAGOGASTRODUODENOSCOPY (EGD)  2014   [facility]   INCISE AND DRAIN ABCESS     Right Groin- Positive MRSA per patient   PILONIDAL CYST DRAINAGE  01/2014   Dr. Ermelinda   PILONIDAL CYST DRAINAGE N/A 03/30/2016   Procedure: IRRIGATION AND DEBRIDEMENT PILONIDAL CYST. Prone position;  Surgeon: Carlin Pastel, MD;  Location: [facility];  Service: General;  Laterality: N/A;   PILONIDAL CYST DRAINAGE N/A 01/07/2021   Procedure: IRRIGATION AND DEBRIDEMENT PILONIDAL CYST;  Surgeon: Desiderio Schanz, MD;  Location: [facility];  Service: General;  Laterality: N/A;  Prone position   PILONIDAL CYST EXCISION N/A 08/28/2021   Procedure: CYST EXCISION PILONIDAL EXTENSIVE;  Surgeon: Desiderio Schanz, MD;   Location: [facility];  Service: General;  Laterality: N/A;  Provider requesting 1.5 hours / 90 minutes for procedure.    OB History     Gravida  0   Para  0   Term  0   Preterm  0   AB  0   Living  0      SAB  0   IAB  0   Ectopic  0   Multiple  0   Live Births  0            Home Medications    Prior to Admission medications  Medication Sig Start Date End Date Taking? Authorizing Provider  oseltamivir  (TAMIFLU ) 75 MG capsule Take 1 capsule (75 mg total) by mouth every 12 (twelve) hours. 02/20/24  Yes Corlis Burnard DEL, NP  acetaminophen  (TYLENOL ) 500 MG tablet Take 2 tablets (1,000 mg total) by mouth every 6 (six) hours as needed for mild pain. 08/28/21   Desiderio Schanz, MD  albuterol  (VENTOLIN  HFA) 108 (90 Base) MCG/ACT inhaler Inhale 2 puffs into the lungs every 4 (four) hours as needed for wheezing or shortness of breath. 12/06/23   Brimage, Vondra, DO  ipratropium (ATROVENT ) 0.06 % nasal spray Place 2 sprays into both nostrils 4 (four) times daily. Patient not taking: Reported on 02/20/2024 12/06/23   Brimage, Vondra, DO  lidocaine  (XYLOCAINE ) 2 % solution Use as directed 15 mLs in the mouth or throat as  needed. 04/05/23   Teresa Shelba SAUNDERS, NP  promethazine -dextromethorphan (PROMETHAZINE -DM) 6.25-15 MG/5ML syrup Take 5 mLs by mouth 4 (four) times daily as needed. Patient not taking: Reported on 02/20/2024 12/06/23   Brimage, Vondra, DO    Family History Family History  Problem Relation Age of Onset   Cancer Mother        cervical   Psoriasis Mother    Skin cancer Mother    COPD Maternal Grandmother    Fibromyalgia Maternal Grandmother    Coronary artery disease Maternal Grandmother     Social History Social History[1]   Allergies   Ibuprofen   Review of Systems Review of Systems  Constitutional:  Positive for chills and fever.  HENT:  Positive for congestion, rhinorrhea and sore throat. Negative for ear pain.   Respiratory:  Positive for cough.  Negative for shortness of breath and wheezing.   Cardiovascular:  Negative for chest pain and palpitations.  Gastrointestinal:  Negative for diarrhea and vomiting.  Genitourinary:  Negative for dysuria, flank pain and hematuria.  Musculoskeletal:  Positive for back pain. Negative for gait problem.  Neurological:  Negative for weakness and numbness.     Physical Exam Triage Vital Signs ED Triage Vitals  Encounter Vitals Group     BP 02/20/24 1433 122/78     Girls Systolic BP Percentile --      Girls Diastolic BP Percentile --      Boys Systolic BP Percentile --      Boys Diastolic BP Percentile --      Pulse Rate 02/20/24 1433 (!) 103     Resp 02/20/24 1433 18     Temp 02/20/24 1433 99.7 F (37.6 C)     Temp src --      SpO2 02/20/24 1433 97 %     Weight --      Height --      Head Circumference --      Peak Flow --      Pain Score 02/20/24 1435 8     Pain Loc --      Pain Education --      Exclude from Growth Chart --    No data found.  Updated Vital Signs BP 122/78   Pulse (!) 103   Temp 99.7 F (37.6 C)   Resp 18   LMP 02/06/2024 (Exact Date)   SpO2 97%   Visual Acuity Right Eye Distance:   Left Eye Distance:   Bilateral Distance:    Right Eye Near:   Left Eye Near:    Bilateral Near:     Physical Exam Constitutional:      General: She is not in acute distress. HENT:     Left Ear: Tympanic membrane normal.     Nose: Rhinorrhea present.     Mouth/Throat:     Mouth: Mucous membranes are moist.     Pharynx: Oropharynx is clear.  Cardiovascular:     Rate and Rhythm: Normal rate and regular rhythm.     Heart sounds: Normal heart sounds.  Pulmonary:     Effort: Pulmonary effort is normal. No respiratory distress.     Breath sounds: Normal breath sounds.  Abdominal:     General: Bowel sounds are normal.     Palpations: Abdomen is soft.     Tenderness: There is no abdominal tenderness. There is no right CVA tenderness, left CVA tenderness, guarding or  rebound.  Neurological:     Mental Status: She is alert.  UC Treatments / Results  Labs (all labs ordered are listed, but only abnormal results are displayed) Labs Reviewed  POCT URINE DIPSTICK - Abnormal; Notable for the following components:      Result Value   Bilirubin, UA small (*)    Ketones, POC UA moderate (40) (*)    Spec Grav, UA >=1.030 (*)    Blood, UA large (*)    Protein Ur, POC =100 (*)    Leukocytes, UA Trace (*)    All other components within normal limits  POC COVID19/FLU A&B COMBO - Abnormal; Notable for the following components:   Influenza A Antigen, POC Positive (*)    All other components within normal limits  POCT URINE PREGNANCY - Normal    EKG   Radiology No results found.  Procedures Procedures (including critical care time)  Medications Ordered in UC Medications - No data to display  Initial Impression / Assessment and Plan / UC Course  I have reviewed the triage vital signs and the nursing notes.  Pertinent labs & imaging results that were available during my care of the patient were reviewed by me and considered in my medical decision making (see chart for details).    Influenza A, acute low back pain without sciatica.  Afebrile and vital signs are stable.  Lungs are clear and O2 sat is 97% on room air.  Patient has history of pyelonephritis in 2023.  She requests urine check today as she is having back pain.  She does not have any urinary symptoms.  Urine has trace leukocytes, along with large blood.  Instructed her to increase her water intake and have her urine rechecked by her PCP this week.  Her flu test is positive for influenza A.  COVID and strep are negative.  She is within the window for treatment and would like treatment with Tamiflu .  Prescription sent to pharmacy for Tamiflu .  Discussed symptomatic treatment including Tylenol  as needed, rest, hydration.  Education provided on influenza.  Instructed patient to follow-up with  her PCP tomorrow.  ED precautions given.  She agrees to plan of care.  Final Clinical Impressions(s) / UC Diagnoses   Final diagnoses:  Influenza A  Acute bilateral low back pain without sciatica     Discharge Instructions      Your flu test is positive for influenza A.  COVID and strep are negative.    Take the Tamiflu  as directed.  Take Tylenol  as needed for fever or discomfort.  Rest and keep yourself hydrated.    Follow up with your primary care provider tomorrow.  Go to the emergency department if you have worsening symptoms.        ED Prescriptions     Medication Sig Dispense Auth. Provider   oseltamivir  (TAMIFLU ) 75 MG capsule Take 1 capsule (75 mg total) by mouth every 12 (twelve) hours. 10 capsule Corlis Burnard DEL, NP      PDMP not reviewed this encounter.    [1]  Social History Tobacco Use   Smoking status: Every Day    Types: E-cigarettes, Cigars    Passive exposure: Past   Smokeless tobacco: Never  Vaping Use   Vaping status: Every Day   Substances: Nicotine   Substance Use Topics   Alcohol use: Yes    Comment: occasional   Drug use: Not Currently    Types: Marijuana    Comment: last use 2019     Corlis Burnard DEL, NP 02/20/24 1520

## 2024-02-20 NOTE — Discharge Instructions (Addendum)
 Your flu test is positive for influenza A.  COVID and strep are negative.    Take the Tamiflu  as directed.  Take Tylenol  as needed for fever or discomfort.  Rest and keep yourself hydrated.    Follow up with your primary care provider tomorrow.  Go to the emergency department if you have worsening symptoms.

## 2024-02-20 NOTE — ED Triage Notes (Signed)
 Pt being seen in UC for flu-like s/s since Friday. Pt reports having cough, chills, body aches, ear pain, back pain, sore throat, fever, and fatigue. Pt reports having rash on RLE. Pt reports taking tylenol . Pt reports being in hospital for kidney infection.
